# Patient Record
Sex: Female | Born: 1990 | Race: White | Hispanic: No | Smoking: Former smoker
Health system: Southern US, Community
[De-identification: ages and names within clinical notes are randomized; demographics above are authoritative.]

## PROBLEM LIST (undated history)

## (undated) ENCOUNTER — Inpatient Hospital Stay (HOSPITAL_COMMUNITY): Payer: Self-pay

## (undated) DIAGNOSIS — B009 Herpesviral infection, unspecified: Secondary | ICD-10-CM

## (undated) DIAGNOSIS — Z789 Other specified health status: Secondary | ICD-10-CM

## (undated) DIAGNOSIS — Z309 Encounter for contraceptive management, unspecified: Secondary | ICD-10-CM

## (undated) DIAGNOSIS — I10 Essential (primary) hypertension: Secondary | ICD-10-CM

## (undated) DIAGNOSIS — E669 Obesity, unspecified: Secondary | ICD-10-CM

## (undated) DIAGNOSIS — Z3490 Encounter for supervision of normal pregnancy, unspecified, unspecified trimester: Secondary | ICD-10-CM

## (undated) HISTORY — DX: Encounter for contraceptive management, unspecified: Z30.9

## (undated) HISTORY — DX: Essential (primary) hypertension: I10

## (undated) HISTORY — DX: Other specified health status: Z78.9

## (undated) HISTORY — DX: Encounter for supervision of normal pregnancy, unspecified, unspecified trimester: Z34.90

## (undated) HISTORY — PX: WISDOM TOOTH EXTRACTION: SHX21

## (undated) HISTORY — DX: Obesity, unspecified: E66.9

## (undated) HISTORY — DX: Herpesviral infection, unspecified: B00.9

---
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
## 2011-02-25 ENCOUNTER — Other Ambulatory Visit (HOSPITAL_COMMUNITY)
Admission: RE | Admit: 2011-02-25 | Discharge: 2011-02-25 | Disposition: A | Discharge status: Home / Self Care | Payer: Commercial Indemnity | Source: Ambulatory Visit | Attending: Obstetrics and Gynecology | Admitting: Obstetrics and Gynecology

## 2011-02-25 DIAGNOSIS — Z113 Encounter for screening for infections with a predominantly sexual mode of transmission: Secondary | ICD-10-CM | POA: Insufficient documentation

## 2011-02-25 DIAGNOSIS — Z01419 Encounter for gynecological examination (general) (routine) without abnormal findings: Secondary | ICD-10-CM | POA: Insufficient documentation

## 2013-01-27 ENCOUNTER — Ambulatory Visit (INDEPENDENT_AMBULATORY_CARE_PROVIDER_SITE_OTHER): Payer: Managed Care, Other (non HMO) | Admitting: Physician Assistant

## 2013-01-27 ENCOUNTER — Encounter: Payer: Self-pay | Admitting: Physician Assistant

## 2013-01-27 VITALS — BP 116/76 | HR 100 | Temp 99.9°F | Resp 20 | Wt 249.0 lb

## 2013-01-27 DIAGNOSIS — J029 Acute pharyngitis, unspecified: Secondary | ICD-10-CM

## 2013-01-27 NOTE — Progress Notes (Signed)
   [name] MRN: [id number], DOB: Apr 30, 1991, 22 y.o. Date of Encounter: 01/27/2013, 4:13 PM    Chief Complaint: Sore Throat   HPI: 22 y.o. year old female reports that she started to feel sick last night. Developed sore throat, drainage down throat, and body aches.  No nasal drainage/mucus.No chest congestion. No signif fever or chills.      Home Meds: No current outpatient prescriptions on file prior to visit.   No current facility-administered medications on file prior to visit.    Allergies: No Known Allergies    Review of Systems: Constitutional: negative for chills,  night sweats, weight changes HEENT: negative for vision changes, hearing loss, congestion, rhinorrhea,  epistaxis, or sinus pressure Respiratory: negative for hemoptysis, wheezing, shortness of breath, or cough Dermatological: negative for rash Neurologic: negative for headache, dizziness, or syncope    Physical Exam: Blood pressure 116/76, pulse 100, temperature 99.9 F (37.7 C), temperature source Oral, resp. rate 20, weight 249 lb (112.946 kg), last menstrual period 01/18/2013., There is no height on file to calculate BMI. General: Well developed, well nourished, in no acute distress. HEENT: Normocephalic, atraumatic, eyes without discharge, sclera non-icteric, nares are without discharge. Bilateral auditory canals clear, TM's are without perforation, pearly grey and translucent with reflective cone of light bilaterally. Oral cavity moist. No peritonsillar abscess, or post nasal drip. Posterior pharynx and tonsils with mild-mod erythema. No exudate.   Neck: Supple. No thyromegaly. Full ROM. No lymphadenopathy.Mild tenderness with palpation of bilateral anterior cervical nodes. Lungs: Clear bilaterally to auscultation without wheezes, rales, or rhonchi. Breathing is unlabored. Heart: RRR with S1 S2. No murmurs, rubs, or gallops appreciated. Msk:  Strength and tone normal for  age. Extremities/Skin: Warm and dry. No clubbing or cyanosis. No edema. No rashes or suspicious lesions. Neuro: Alert and oriented X 3. Moves all extremities spontaneously. Gait is normal. CNII-XII grossly in tact. Psych:  Responds to questions appropriately with a normal affect.   Labs:  Rapid Strep Test Negative   ASSESSMENT AND PLAN:  22 y.o. year old female with 1. Sore throat. Viral Pharyngitis. Lozenges, Spray, Tylenol,Motrin prn pain.  If develops increased fever or signif worsened symptoms or if persists > 7 days, f/u. -  Signed, [address] [place], [place], [facility] 01/27/2013 4:13 PM

## 2013-02-01 ENCOUNTER — Telehealth: Payer: Self-pay | Admitting: Physician Assistant

## 2013-02-01 DIAGNOSIS — J029 Acute pharyngitis, unspecified: Secondary | ICD-10-CM

## 2013-02-01 MED ORDER — AMOXICILLIN 875 MG PO TABS: 875.0000 mg | ORAL_TABLET | Freq: Two times a day (BID) | ORAL | Status: AC

## 2013-02-01 NOTE — Telephone Encounter (Signed)
Kim,  Call in Rx: Amoxicillin 875mg  1 po BID x 10 Days. Call pt and let her know.

## 2013-02-01 NOTE — Telephone Encounter (Signed)
Pt called and informed Amoxicillin to be called in.  Take BID for 10 days.

## 2013-03-03 ENCOUNTER — Encounter: Payer: Self-pay | Admitting: *Deleted

## 2013-03-04 ENCOUNTER — Encounter: Payer: Self-pay | Admitting: Adult Health

## 2013-03-04 ENCOUNTER — Ambulatory Visit (INDEPENDENT_AMBULATORY_CARE_PROVIDER_SITE_OTHER): Payer: Managed Care, Other (non HMO) | Admitting: Adult Health

## 2013-03-04 ENCOUNTER — Other Ambulatory Visit (HOSPITAL_COMMUNITY)
Admission: RE | Admit: 2013-03-04 | Discharge: 2013-03-04 | Disposition: A | Discharge status: Home / Self Care | Payer: Commercial Indemnity | Source: Ambulatory Visit | Attending: Obstetrics and Gynecology | Admitting: Obstetrics and Gynecology

## 2013-03-04 VITALS — BP 108/70 | HR 76 | Ht 63.0 in | Wt 249.0 lb

## 2013-03-04 DIAGNOSIS — Z0000 Encounter for general adult medical examination without abnormal findings: Secondary | ICD-10-CM

## 2013-03-04 DIAGNOSIS — Z01419 Encounter for gynecological examination (general) (routine) without abnormal findings: Secondary | ICD-10-CM | POA: Insufficient documentation

## 2013-03-04 DIAGNOSIS — Z309 Encounter for contraceptive management, unspecified: Secondary | ICD-10-CM

## 2013-03-04 HISTORY — DX: Encounter for contraceptive management, unspecified: Z30.9

## 2013-03-04 MED ORDER — DROSPIRENONE-ETHINYL ESTRADIOL 3-0.02 MG PO TABS: 1.0000 | ORAL_TABLET | Freq: Every day | ORAL | Status: DC

## 2013-03-04 NOTE — Patient Instructions (Addendum)
Take  yaz Return 1 year for physical Sign up for my chart

## 2013-03-04 NOTE — Progress Notes (Signed)
[name], female   DOB: [birth year], 22 y.o.   MRN: [id number] History of Present Illness: Brittany [name] is a 22 year old white female in for her pap and physical. She likes her Yaz. 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  Current Medications, Allergies, Past Medical History, Past Surgical History, Family History and Social History were reviewed in [employer] record.     Review of Systems:Patient denies any headaches, blurred vision, shortness of breath, chest pain, abdominal pain, problems with bowel movements, urination, or intercourse. No musculoskeletal aches or swelling. No mood changes.   Physical Exam:Blood pressure 108/70, pulse 76, height 5\' 3"  (1.6 m), weight 249 lb (112.946 kg), last menstrual period 02/10/2013. General:  Well developed, well nourished, no acute distress Skin:  Warm and dry, tan Neck:  Midline trachea, normal thyroid Lungs; Clear to auscultation bilaterally Breast:  No dominant palpable mass, retraction, or nipple discharge Cardiovascular: Regular rate and rhythm Abdomen:  Soft, non tender, no hepatosplenomegaly Pelvic:  External genitalia is normal in appearance.  The vagina is normal in appearance. The cervix is nulliparous and everted at os. Pap performed.  Uterus is felt to be normal size, shape, and contour.  No adnexal masses or tenderness noted. Extremities:  No swelling or varicosities noted Psych: Alert and cooperative, and happy  Impression: Yearly exam Contraceptive management  Plan:  Continue Yaz Return in 1 year for physical and can go 2 years on pap

## 2013-04-19 ENCOUNTER — Telehealth: Payer: Self-pay | Admitting: Physician Assistant

## 2013-04-19 NOTE — Telephone Encounter (Signed)
Valtrex 1 gram  Take 2 po Q 12 hours for 2 doses # 4 / 3 refills

## 2013-04-20 MED ORDER — VALACYCLOVIR HCL 1 G PO TABS: ORAL_TABLET | ORAL | Status: DC

## 2013-04-20 NOTE — Telephone Encounter (Signed)
RX sent to pharmacy.  Called pt left mess

## 2013-08-02 ENCOUNTER — Ambulatory Visit (INDEPENDENT_AMBULATORY_CARE_PROVIDER_SITE_OTHER): Payer: Managed Care, Other (non HMO) | Admitting: Family Medicine

## 2013-08-02 ENCOUNTER — Encounter: Payer: Self-pay | Admitting: Family Medicine

## 2013-08-02 DIAGNOSIS — E669 Obesity, unspecified: Secondary | ICD-10-CM | POA: Insufficient documentation

## 2013-08-02 MED ORDER — PHENTERMINE HCL 37.5 MG PO TABS: 37.5000 mg | ORAL_TABLET | Freq: Every day | ORAL | Status: DC

## 2013-08-02 NOTE — Progress Notes (Signed)
Subjective:    [name], female    DOB: November 05, 1991, 22 y.o.   MRN: [id number]  HPI Patient is a very pleasant 61 white female who comes in today for her morbid obesity. She is 5 foot 3 and weighs 251 pounds. She is 100 pounds above her goal weight. She states that she is active although she does not engage in dedicated aerobic exercise. She readily admits that she can much fast food, to much bread, and to many carbohydrates. She also drinks sodas and sweet tea.  She is interested in medication for weight loss. She is also interested in treatment strategies for obesity that involve lifestyle changes.  She denies palpitations, anxiety, chest pain, or history of sudden cardiac death. Past Medical History  Diagnosis Date  . Medical history non-contributory   . Contraceptive management 03/04/2013  . Obesity    Current Outpatient Prescriptions on File Prior to Visit  Medication Sig Dispense Refill  . drospirenone-ethinyl estradiol (YAZ,GIANVI,LORYNA) 3-0.02 MG tablet Take 1 tablet by mouth daily.  1 Package  11  . valACYclovir (VALTREX) 1000 MG tablet Take two tablets by mouth every 12 hrs X 2 doses  4 tablet  3   No current facility-administered medications on file prior to visit.   No Known Allergies History   Social History  . Marital Status: Single    Spouse Name: N/A    Number of Children: N/A  . Years of Education: N/A   Occupational History  . Not on file.   Social History Main Topics  . Smoking status: Former Smoker    Quit date: 01/14/2013  . Smokeless tobacco: Never Used  . Alcohol Use: Yes     Comment: socially  . Drug Use: No  . Sexual Activity: Yes    Birth Control/ Protection: Pill   Other Topics Concern  . Not on file   Social History Narrative  . No narrative on file      Review of Systems  All other systems reviewed and are negative.       Objective:   Physical Exam  Vitals reviewed. Constitutional: She appears well-developed and  well-nourished.  Neck: No thyromegaly present.  Cardiovascular: Normal rate, regular rhythm and normal heart sounds.   No murmur heard. Pulmonary/Chest: Effort normal and breath sounds normal. No respiratory distress. She has no wheezes. She has no rales.  Abdominal: Soft. Bowel sounds are normal. She exhibits no distension. There is no tenderness. There is no rebound.          Assessment & Plan:  1. Morbid obesity First I recommended 1500 calories per day diet. This 500 calories per meal. I recommended she discontinue also does, juices, and sweat tea.  I recommended that she drink water. Also recommended she decrease the consumption of carbohydrates. This includes limiting bread, rice, posture, and potatoes. I recommended she eat more fresh fruits and vegetables. Also recommended that she stop eating fast food and junk food due to their elevated caloric density.  Second I recommended that she increase her aerobic exercise to 30 minutes a day 5 days a week. I recommended she find an exercise partner such as her mother that they can motivate each other to exercise on a regular basis. Third we discussed several medications for weight loss including Topamax, and adipex, orlistat, and belviq.  I stressed that these are to be viewed as adjunctive therapy to lifestyle changes.  She elects to try adipex 37.5 mg poqam.  I prescribed  30 pills with 2 refills. We discussed risk of habituation, cardiovascular risk, and the risk of palpitations and anxiety attacks and insomnia. Recheck in 3 months. Consider TSH. - phentermine (ADIPEX-P) 37.5 MG tablet; Take 1 tablet (37.5 mg total) by mouth daily before breakfast.  Dispense: 30 tablet; Refill: 2

## 2014-02-04 ENCOUNTER — Telehealth: Payer: Self-pay | Admitting: Family Medicine

## 2014-02-04 NOTE — Telephone Encounter (Signed)
Denied.

## 2014-02-04 NOTE — Telephone Encounter (Signed)
Call back number is [phone] Pharmacy [facility]  Pt is needing a refill on phentermine (ADIPEX-P) 37.5 MG tablet ---does the pt need to be seen before this can be refilled or can she just have a refill?

## 2014-02-04 NOTE — Telephone Encounter (Signed)
Pt aware that Dr. Tanya NonesPickard only rx's this type of medication for a 3 month period

## 2014-02-04 NOTE — Telephone Encounter (Signed)
Last seen 08/02/13

## 2014-02-05 ENCOUNTER — Other Ambulatory Visit: Payer: Self-pay | Admitting: Adult Health

## 2014-03-07 ENCOUNTER — Ambulatory Visit (INDEPENDENT_AMBULATORY_CARE_PROVIDER_SITE_OTHER): Payer: Managed Care, Other (non HMO) | Admitting: Adult Health

## 2014-03-07 ENCOUNTER — Encounter: Payer: Self-pay | Admitting: Adult Health

## 2014-03-07 ENCOUNTER — Other Ambulatory Visit: Payer: Managed Care, Other (non HMO) | Admitting: Adult Health

## 2014-03-07 VITALS — BP 120/82 | HR 74 | Ht 63.0 in | Wt 230.0 lb

## 2014-03-07 DIAGNOSIS — Z01419 Encounter for gynecological examination (general) (routine) without abnormal findings: Secondary | ICD-10-CM

## 2014-03-07 DIAGNOSIS — Z309 Encounter for contraceptive management, unspecified: Secondary | ICD-10-CM

## 2014-03-07 DIAGNOSIS — E669 Obesity, unspecified: Secondary | ICD-10-CM

## 2014-03-07 MED ORDER — DROSPIRENONE-ETHINYL ESTRADIOL 3-0.02 MG PO TABS: ORAL_TABLET | ORAL | Status: DC

## 2014-03-07 MED ORDER — PHENTERMINE HCL 37.5 MG PO CAPS: 37.5000 mg | ORAL_CAPSULE | ORAL | Status: DC

## 2014-03-07 NOTE — Progress Notes (Signed)
[name], female   DOB: [birth year], [age] y.o.   MRN: [id number] History of Present Illness: Brittany [name] is a 23 year old white female in for a physical.Had normal pap 03/04/13. Wants to lose weight has used adipex in past and it worked well.  Current Medications, Allergies, Past Medical History, Past Surgical History, Family History and Social History were [facility].     Review of Systems: Patient denies any headaches, blurred vision, shortness of breath, chest pain, abdominal pain, problems with bowel movements, urination, or intercourse. No joint pain or mood swings     Physical Exam:BP 120/82  Pulse 74  Ht 5\' 3"  (1.6 m)  Wt 230 lb (104.327 kg)  BMI 40.75 kg/m2  LMP 02/02/2014 General:  Well developed, well nourished, no acute distress Skin:  Warm and dry Neck:  Midline trachea, normal thyroid Lungs; Clear to auscultation bilaterally Breast:  No dominant palpable mass, retraction, or nipple discharge Cardiovascular: Regular rate and rhythm Abdomen:  Soft, non tender, no hepatosplenomegaly Pelvic:  External genitalia is normal in appearance.  The vagina is normal in appearance. The cervix is everted at os.  Uterus is felt to be normal size, shape, and contour.  No adnexal masses or tenderness noted. Extremities:  No swelling or varicosities noted Psych:  No mood changes,alert and cooperative,seems happy  Impression: Yearly gyn exam no pap Contraceptive management Obesity,weight management   Plan: Rx adipex 37.5 mg #30 1 daily no refills Follow up in  4 weeks Review handout on weight loss Pap and physical in  1 year

## 2014-03-07 NOTE — Patient Instructions (Signed)
Calorie Counting Diet A calorie counting diet requires you to eat the number of calories that are right for you in a day. Calories are the measurement of how much energy you get from the food you eat. Eating the right amount of calories is important for staying at a healthy weight. If you eat too many calories, your body will store them as fat and you may gain weight. If you eat too few calories, you may lose weight. Counting the number of calories you eat during a day will help you know if you are eating the right amount. A Registered Dietitian can determine how many calories you need in a day. The amount of calories needed varies from person to person. If your goal is to lose weight, you will need to eat fewer calories. Losing weight can benefit you if you are overweight or have health problems such as heart disease, high blood pressure, or diabetes. If your goal is to gain weight, you will need to eat more calories. Gaining weight may be necessary if you have a certain health problem that causes your body to need more energy. TIPS Whether you are increasing or decreasing the number of calories you eat during a day, it may be hard to get used to changes in what you eat and drink. The following are tips to help you keep track of the number of calories you eat.  Measure foods at home with measuring cups. This helps you know the amount of food and number of calories you are eating.  Restaurants often serve food in amounts that are larger than 1 serving. While eating out, estimate how many servings of a food you are given. For example, a serving of cooked rice is  cup or about the size of half of a fist. Knowing serving sizes will help you be aware of how much food you are eating at restaurants.  Ask for smaller portion sizes or child-size portions at restaurants.  Plan to eat half of a meal at a restaurant. Take the rest home or share the other half with a friend.  Read the Nutrition Facts panel on  food labels for calorie content and serving size. You can find out how many servings are in a package, the size of a serving, and the number of calories each serving has.  For example, a package might contain 3 cookies. The Nutrition Facts panel on that package says that 1 serving is 1 cookie. Below that, it will say there are 3 servings in the container. The calories section of the Nutrition Facts label says there are 90 calories. This means there are 90 calories in 1 cookie (1 serving). If you eat 1 cookie you have eaten 90 calories. If you eat all 3 cookies, you have eaten 270 calories (3 servings x 90 calories = 270 calories). The list below tells you how big or small some common portion sizes are.  1 oz.........4 stacked dice.  3 oz.........Deck of cards.  1 tsp........Tip of little finger.  1 tbs........Thumb.  2 tbs........Golf ball.   cup.......Half of a fist.  1 cup........A fist. KEEP A FOOD LOG Write down every food item you eat, the amount you eat, and the number of calories in each food you eat during the day. At the end of the day, you can add up the total number of calories you have eaten. It may help to keep a list like the one below. Find out the calorie information by reading the   Nutrition Facts panel on food labels. Breakfast  Bran cereal (1 cup, 110 calories).  Fat-free milk ( cup, 45 calories). Snack  Apple (1 medium, 80 calories). Lunch  Spinach (1 cup, 20 calories).  Tomato ( medium, 20 calories).  Chicken breast strips (3 oz, 165 calories).  Shredded cheddar cheese ( cup, 110 calories).  Light [place] dressing (2 tbs, 60 calories).  Whole-wheat bread (1 slice, 80 calories).  Tub margarine (1 tsp, 35 calories).  Vegetable soup (1 cup, 160 calories). Dinner  Pork chop (3 oz, 190 calories).  Brown rice (1 cup, 215 calories).  Steamed broccoli ( cup, 20 calories).  Strawberries (1  cup, 65 calories).  Whipped cream (1 tbs, 50  calories). Daily Calorie Total: 1425 Document Released: 10/28/2005 Document Revised: 01/20/2012 Document Reviewed: 04/24/2007 [facility] Patient Information 2014 [place], [place]. Try adipex  Follow up in 4 weeks Pap and physical in 1 year

## 2014-04-06 ENCOUNTER — Ambulatory Visit: Payer: Managed Care, Other (non HMO) | Admitting: Adult Health

## 2014-04-11 ENCOUNTER — Ambulatory Visit (INDEPENDENT_AMBULATORY_CARE_PROVIDER_SITE_OTHER): Payer: Managed Care, Other (non HMO) | Admitting: Adult Health

## 2014-04-11 ENCOUNTER — Encounter: Payer: Self-pay | Admitting: Adult Health

## 2014-04-11 VITALS — BP 110/70 | Ht 63.0 in | Wt 223.0 lb

## 2014-04-11 DIAGNOSIS — E669 Obesity, unspecified: Secondary | ICD-10-CM

## 2014-04-11 MED ORDER — PHENTERMINE HCL 37.5 MG PO CAPS: 37.5000 mg | ORAL_CAPSULE | ORAL | Status: DC

## 2014-04-11 NOTE — Patient Instructions (Signed)
Continue weight loss efforts Follow up in 4 weeks 

## 2014-04-11 NOTE — Progress Notes (Signed)
Subjective:     [name], female   DOB: November 15, 1990, 23 y.o.   MRN: [id number]  HPI Brittany [name] is a 23 year old white female in for BP and weight check after starting adipex 03/07/14.No complaints.  Review of Systems See HPI Reviewed past medical,surgical, social and family history. Reviewed medications and allergies.     Objective:   Physical Exam BP 110/70  Ht 5\' 3"  (1.6 m)  Wt 223 lb (101.152 kg)  BMI 39.51 kg/m2  LMP 04/09/2014   Skin warm and dry. Lungs: clear to ausculation bilaterally. Cardiovascular: regular rate and rhythm. She has lost 7 lbs and wants to continue weight loss, she should exercise more.Praised over efforts thus far and she is pleased.Goal is 1-2 lbs per week.  Assessment:     Obesity    Plan:     Rx adipex 37.5 mg #30 1 daily no refills  Return in 4 weeks for weight and BP check Continue weight loss efforts and increase activity

## 2014-05-09 ENCOUNTER — Encounter: Payer: Self-pay | Admitting: Adult Health

## 2014-05-09 ENCOUNTER — Ambulatory Visit (INDEPENDENT_AMBULATORY_CARE_PROVIDER_SITE_OTHER): Payer: Managed Care, Other (non HMO) | Admitting: Adult Health

## 2014-05-09 VITALS — BP 122/82 | Ht 63.0 in | Wt 220.0 lb

## 2014-05-09 DIAGNOSIS — E669 Obesity, unspecified: Secondary | ICD-10-CM

## 2014-05-09 MED ORDER — PHENTERMINE HCL 37.5 MG PO CAPS: 37.5000 mg | ORAL_CAPSULE | ORAL | Status: DC

## 2014-05-09 NOTE — Progress Notes (Signed)
Subjective:     Patient ID: Brittany KatoEmily N [name], female   DOB: 09-12-1991, 23 y.o.   MRN: [id number]  HPI Brittany [name] is a 23 year old white female in for weight and BP check.No complaints.  Review of Systems See HPI Reviewed past medical,surgical, social and family history. Reviewed medications and allergies.     Objective:   Physical Exam BP 122/82  Ht 5\' 3"  (1.6 m)  Wt 220 lb (99.791 kg)  BMI 38.98 kg/m2  LMP 06/27/2015Has lost 3 lbs since last visit for a total of 7 lbs.   Skin warm and dry. Lungs: clear to ausculation bilaterally. Cardiovascular: regular rate and rhythm. Wants to continue taking adipex.Eat lots of veggies and water.  Assessment:    Obesity Weight management    Plan:     Rx adipex 37.5 mg #30 1 daily no refills Follow up in 4 weeks Continue weight loss efforts

## 2014-05-09 NOTE — Patient Instructions (Signed)
Continue weight loss efforts Follow up in 4 weeks 

## 2014-06-07 ENCOUNTER — Ambulatory Visit: Payer: Managed Care, Other (non HMO) | Admitting: Adult Health

## 2014-06-08 ENCOUNTER — Encounter: Payer: Self-pay | Admitting: Adult Health

## 2014-06-08 ENCOUNTER — Ambulatory Visit (INDEPENDENT_AMBULATORY_CARE_PROVIDER_SITE_OTHER): Payer: Managed Care, Other (non HMO) | Admitting: Adult Health

## 2014-06-08 VITALS — BP 120/80 | Ht 63.0 in | Wt 215.0 lb

## 2014-06-08 DIAGNOSIS — E669 Obesity, unspecified: Secondary | ICD-10-CM

## 2014-06-08 MED ORDER — PHENTERMINE HCL 37.5 MG PO CAPS: 37.5000 mg | ORAL_CAPSULE | ORAL | Status: DC

## 2014-06-08 NOTE — Progress Notes (Signed)
Subjective:     Patient ID: Brittany KatoEmily N [name], female   DOB: 02-15-1991, 23 y.o.   MRN: [id number]  HPI Brittany Burtonmily is a 23 year old white female in for weight and BP check, she has lost 35 lbs since September, using adipex on and off.No complaints.  Review of Systems See HPI Reviewed past medical,surgical, social and family history. Reviewed medications and allergies.     Objective:   Physical Exam BP 120/80  Ht 5\' 3"  (1.6 m)  Wt 215 lb (97.523 kg)  BMI 38.09 kg/m2  LMP 06/04/2014   Skin warm and dry. Lungs: clear to ausculation bilaterally. Cardiovascular: regular rate and rhythm. Lost 5 lbs this time, will go one more month then take break and she how she does on her own.  Assessment:     Obesity     Plan:    Continue weight loss efforts and increase exercise Rx adipex 37.5 mg #30 1 daily no refills Follow up prn

## 2014-06-08 NOTE — Patient Instructions (Signed)
Continue weight loss efforts increase exercise Follow up prn

## 2014-09-14 ENCOUNTER — Encounter: Payer: Self-pay | Admitting: Physician Assistant

## 2014-09-14 ENCOUNTER — Ambulatory Visit: Payer: Managed Care, Other (non HMO) | Admitting: Family Medicine

## 2014-09-14 ENCOUNTER — Ambulatory Visit (INDEPENDENT_AMBULATORY_CARE_PROVIDER_SITE_OTHER): Payer: Managed Care, Other (non HMO) | Admitting: Physician Assistant

## 2014-09-14 VITALS — BP 118/86 | HR 68 | Temp 98.3°F | Resp 18 | Wt 212.0 lb

## 2014-09-14 DIAGNOSIS — J020 Streptococcal pharyngitis: Secondary | ICD-10-CM

## 2014-09-14 DIAGNOSIS — J029 Acute pharyngitis, unspecified: Secondary | ICD-10-CM

## 2014-09-14 LAB — RAPID STREP SCREEN (MED CTR MEBANE ONLY): STREPTOCOCCUS, GROUP A SCREEN (DIRECT): POSITIVE — AB

## 2014-09-14 MED ORDER — AMOXICILLIN 500 MG PO CAPS: 500.0000 mg | ORAL_CAPSULE | Freq: Three times a day (TID) | ORAL | Status: DC

## 2014-09-14 NOTE — Progress Notes (Signed)
    [name] MRN: [id number], DOB: Mar 01, 1991, 23 y.o. Date of Encounter: 09/14/2014, 11:42 AM    Chief Complaint:  Chief Complaint  Patient presents with  . sore throat x 3 days     HPI: 23 y.o. year old white female reports that she has had a bad sore throat for 3 days. Has had no known fever. Is having no significant nasal congestion or mucus from the nose and no significant chest congestion or cough. Just really bad sore throat.     Home Meds:   Outpatient Prescriptions Prior to Visit  Medication Sig Dispense Refill  . drospirenone-ethinyl estradiol (LORYNA) 3-0.02 MG tablet take 1 tablet by mouth daily 28 tablet 11  . valACYclovir (VALTREX) 1000 MG tablet Take two tablets by mouth every 12 hrs X 2 doses 4 tablet 3  . phentermine 37.5 MG capsule Take 1 capsule (37.5 mg total) by mouth every morning. 30 capsule 0   No facility-administered medications prior to visit.    Allergies: No Known Allergies    Review of Systems: See HPI for pertinent ROS. All other ROS negative.    Physical Exam: Blood pressure 118/86, pulse 68, temperature 98.3 F (36.8 C), temperature source Oral, resp. rate 18, weight 212 lb (96.163 kg)., Body mass index is 37.56 kg/(m^2). General: WNWD WF.  Appears in no acute distress. HEENT: Normocephalic, atraumatic, eyes without discharge, sclera non-icteric, nares are without discharge. Bilateral auditory canals clear, TM's are without perforation, pearly grey and translucent with reflective cone of light bilaterally. Oral cavity moist, posterior pharynx and tonsils with moderate erythema. No exudate visualized. No peritonsillar abscess.  Neck: Supple. No thyromegaly. Anterior cervical lymph nodes are mildly enlarged but she reports that these really are not very tender. No other lymph nodes are enlarged or tender. Lungs: Clear bilaterally to auscultation without wheezes, rales, or rhonchi. Breathing is unlabored. Heart: Regular rhythm.  No murmurs, rubs, or gallops. Msk:  Strength and tone normal for age. Extremities/Skin: Warm and dry.  No rashes. Neuro: Alert and oriented X 3. Moves all extremities spontaneously. Gait is normal. CNII-XII grossly in tact. Psych:  Responds to questions appropriately with a normal affect.      ASSESSMENT AND PLAN:  23 y.o. year old female with  1. Strep pharyngitis - amoxicillin (AMOXIL) 500 MG capsule; Take 1 capsule (500 mg total) by mouth 3 (three) times daily.  Dispense: 30 capsule; Refill: 0 Take the amoxicillin as directed and make sure to complete all of it. Can use over-the-counter lozenges spray Tylenol Motrin as needed for pain relief. Follow-up if symptoms worsen or do not resolve with completion of antibiotic.  2. Sorethroat - Rapid Strep Screen   Signed, [address], [place], [facility] 09/14/2014 11:42 AM

## 2014-11-16 ENCOUNTER — Encounter: Payer: Self-pay | Admitting: Physician Assistant

## 2014-11-16 ENCOUNTER — Ambulatory Visit (INDEPENDENT_AMBULATORY_CARE_PROVIDER_SITE_OTHER): Payer: Managed Care, Other (non HMO) | Admitting: Physician Assistant

## 2014-11-16 VITALS — BP 114/74 | HR 100 | Temp 98.7°F | Resp 18 | Wt 216.0 lb

## 2014-11-16 DIAGNOSIS — J029 Acute pharyngitis, unspecified: Secondary | ICD-10-CM

## 2014-11-16 LAB — RAPID STREP SCREEN (MED CTR MEBANE ONLY): Streptococcus, Group A Screen (Direct): NEGATIVE

## 2014-11-17 NOTE — Progress Notes (Signed)
    [name] MRN: [id number], DOB: 04-Nov-1991, 23 y.o. Date of Encounter: 11/17/2014, 7:48 AM    Chief Complaint:  Chief Complaint  Patient presents with  . recurrent sore throat    had strep 1 mth ago     HPI: 24 y.o. year old white female presents with the above symptoms.  I reviewed her chart. I saw her on 09/14/14 with complaints of sore throat. Strep test was positive. Treated with amoxicillin 500 mg 3 times a day 10 days. She says that she did take the antibiotic as directed completed all of it. She says that those symptoms completely resolved.  She had no recurrent symptoms until late Monday/Tuesday morning. Tuesday morning was 11/15/14.  She says that she just has sore throat. Really has no nasal congestion or mucus from the nose. No chest congestion or cough. No fevers or chills.     Home Meds:   Outpatient Prescriptions Prior to Visit  Medication Sig Dispense Refill  . drospirenone-ethinyl estradiol (LORYNA) 3-0.02 MG tablet take 1 tablet by mouth daily 28 tablet 11  . valACYclovir (VALTREX) 1000 MG tablet Take two tablets by mouth every 12 hrs X 2 doses 4 tablet 3  . phentermine 37.5 MG capsule Take 1 capsule (37.5 mg total) by mouth every morning. (Patient not taking: Reported on 11/16/2014) 30 capsule 0  . amoxicillin (AMOXIL) 500 MG capsule Take 1 capsule (500 mg total) by mouth 3 (three) times daily. 30 capsule 0   No facility-administered medications prior to visit.    Allergies: No Known Allergies    Review of Systems: See HPI for pertinent ROS. All other ROS negative.    Physical Exam: Blood pressure 114/74, pulse 100, temperature 98.7 F (37.1 C), temperature source Oral, resp. rate 18, weight 216 lb (97.977 kg)., Body mass index is 38.27 kg/(m^2). General: Obese white female.  Appears in no acute distress. HEENT: Normocephalic, atraumatic, eyes without discharge, sclera non-icteric, nares are without discharge. Bilateral auditory canals  clear, TM's are without perforation, pearly grey and translucent with reflective cone of light bilaterally. Oral cavity moist, posterior pharynx without exudate or peritonsillar abscess. There is mild-moderate erythema. Neck: Supple. No thyromegaly. No lymphadenopathy. Lymph nodes are not significantly tender or enlarged. Lungs: Clear bilaterally to auscultation without wheezes, rales, or rhonchi. Breathing is unlabored. Heart: Regular rhythm. No murmurs, rubs, or gallops. Msk:  Strength and tone normal for age. Extremities/Skin: Warm and dry.  Neuro: Alert and oriented X 3. Moves all extremities spontaneously. Gait is normal. CNII-XII grossly in tact. Psych:  Responds to questions appropriately with a normal affect.     ASSESSMENT AND PLAN:  24 y.o. year old female with  1. Viral pharyngitis  2. Sorethroat - Rapid strep screen  Results for orders placed or performed in visit on 11/16/14  Rapid strep screen  Result Value Ref Range   Source THROAT    Streptococcus, Group A Screen (Direct) NEG NEGATIVE   Discussed that I think that this infection is a viral infection. Treat symptomatically with lozenges spray Tylenol Motrin as needed for pain. Call us if develops significant fever or if symptoms worsen significantly or persist greater than 7 days.   Murray HodgkinsSigned, Danett Palazzo Beth Silver CreekDixon, [place]PA, [facility] 11/17/2014 7:48 AM

## 2014-11-23 ENCOUNTER — Telehealth: Payer: Self-pay | Admitting: Family Medicine

## 2014-11-23 NOTE — Telephone Encounter (Signed)
[phone] Patient calling saying her throat is still hurting her would like to knowif we can call something in for her  [facility]

## 2014-11-23 NOTE — Telephone Encounter (Signed)
Yes.  Amoxicillin 500mg  1 po TID x 10 days # 30 + 0.

## 2014-11-24 MED ORDER — AMOXICILLIN 500 MG PO CAPS: 500.0000 mg | ORAL_CAPSULE | Freq: Three times a day (TID) | ORAL | Status: DC

## 2014-11-24 NOTE — Telephone Encounter (Signed)
Rx to pharmacy, left pt message.

## 2015-02-22 ENCOUNTER — Telehealth: Payer: Self-pay | Admitting: Family Medicine

## 2015-02-22 MED ORDER — VALACYCLOVIR HCL 1 G PO TABS: ORAL_TABLET | ORAL | Status: DC

## 2015-02-22 NOTE — Telephone Encounter (Signed)
Med sent to pharm 

## 2015-02-22 NOTE — Telephone Encounter (Signed)
Patient would like refill on valtrex  [facility]

## 2015-03-05 ENCOUNTER — Other Ambulatory Visit: Payer: Self-pay | Admitting: Adult Health

## 2015-03-10 ENCOUNTER — Other Ambulatory Visit (HOSPITAL_COMMUNITY)
Admission: RE | Admit: 2015-03-10 | Discharge: 2015-03-10 | Disposition: A | Discharge status: Home / Self Care | Payer: Commercial Indemnity | Source: Ambulatory Visit | Attending: Obstetrics and Gynecology | Admitting: Obstetrics and Gynecology

## 2015-03-10 ENCOUNTER — Ambulatory Visit (INDEPENDENT_AMBULATORY_CARE_PROVIDER_SITE_OTHER): Payer: Managed Care, Other (non HMO) | Admitting: Adult Health

## 2015-03-10 ENCOUNTER — Encounter: Payer: Self-pay | Admitting: Adult Health

## 2015-03-10 VITALS — BP 118/70 | HR 88 | Ht 62.5 in | Wt 226.0 lb

## 2015-03-10 DIAGNOSIS — E669 Obesity, unspecified: Secondary | ICD-10-CM

## 2015-03-10 DIAGNOSIS — Z3041 Encounter for surveillance of contraceptive pills: Secondary | ICD-10-CM

## 2015-03-10 DIAGNOSIS — Z01419 Encounter for gynecological examination (general) (routine) without abnormal findings: Secondary | ICD-10-CM | POA: Insufficient documentation

## 2015-03-10 MED ORDER — DROSPIRENONE-ETHINYL ESTRADIOL 3-0.02 MG PO TABS: 1.0000 | ORAL_TABLET | Freq: Every day | ORAL | Status: DC

## 2015-03-10 MED ORDER — PHENTERMINE HCL 37.5 MG PO TABS: 37.5000 mg | ORAL_TABLET | Freq: Every day | ORAL | Status: DC

## 2015-03-10 NOTE — Progress Notes (Signed)
[name], female   DOB: 01-10-1991, 24 y.o.   MRN: [id number] History of Present Illness: Brittany [name] is a 24 year old white female in for well woman gyn exam and pap and wants to lose weight.   Current Medications, Allergies, Past Medical History, Past Surgical History, Family History and Social History were [facility].     Review of Systems: Patient denies any headaches, hearing loss, fatigue, blurred vision, shortness of breath, chest pain, abdominal pain, problems with bowel movements, urination, or intercourse. No joint pain or mood swings.She is happy with Brittany [name] and wants to continue.    Physical Exam:BP 118/70 mmHg  Pulse 88  Ht 5' 2.5" (1.588 m)  Wt 226 lb (102.513 kg)  BMI 40.65 kg/m2  LMP 02/04/2015 General:  Well developed, well nourished, no acute distress Skin:  Warm and dry Neck:  Midline trachea, normal thyroid, good ROM, no lymphadenopathy Lungs; Clear to auscultation bilaterally Breast:  No dominant palpable mass, retraction, or nipple discharge Cardiovascular: Regular rate and rhythm Abdomen:  Soft, non tender, no hepatosplenomegaly Pelvic:  External genitalia is normal in appearance, no lesions.  The vagina is normal in appearance. Urethra has no lesions or masses. The cervix is everted at os, pap perfomred.  Uterus is felt to be normal size, shape, and contour.  No adnexal masses or tenderness noted.Bladder is non tender, no masses felt. Extremities/musculoskeletal:  No swelling or varicosities noted, no clubbing or cyanosis Psych:  No mood changes, alert and cooperative,seems happy   Impression: Well woman gyn exam with pap Contraceptive management Obesity     Plan: Refilled yaz, take 1 daily # 1 pack with 11 refills Rx adipex 37.5 mg #30 1 daily no refills  Return in 4 weeks for weight and BP check Physical in 1 year Review handout on weight loss, increase protein,decrease carbs, increase water and  walk

## 2015-03-10 NOTE — Patient Instructions (Signed)
Physical in 1 year Follow up in 4 weeks Calorie Counting for Weight Loss Calories are energy you get from the things you eat and drink. Your body uses this energy to keep you going throughout the day. The number of calories you eat affects your weight. When you eat more calories than your body needs, your body stores the extra calories as fat. When you eat fewer calories than your body needs, your body burns fat to get the energy it needs. Calorie counting means keeping track of how many calories you eat and drink each day. If you make sure to eat fewer calories than your body needs, you should lose weight. In order for calorie counting to work, you will need to eat the number of calories that are right for you in a day to lose a healthy amount of weight per week. A healthy amount of weight to lose per week is usually 1-2 lb (0.5-0.9 kg). A dietitian can determine how many calories you need in a day and give you suggestions on how to reach your calorie goal.  WHAT IS MY MY PLAN? My goal is to have __________ calories per day.  If I have this many calories per day, I should lose around __________ pounds per week. WHAT DO I NEED TO KNOW ABOUT CALORIE COUNTING? In order to meet your daily calorie goal, you will need to:  Find out how many calories are in each food you would like to eat. Try to do this before you eat.  Decide how much of the food you can eat.  Write down what you ate and how many calories it had. Doing this is called keeping a food log. WHERE DO I FIND CALORIE INFORMATION? The number of calories in a food can be found on a Nutrition Facts label. Note that all the information on a label is based on a specific serving of the food. If a food does not have a Nutrition Facts label, try to look up the calories online or ask your dietitian for help. HOW DO I DECIDE HOW MUCH TO EAT? To decide how much of the food you can eat, you will need to consider both the number of calories in one  serving and the size of one serving. This information can be found on the Nutrition Facts label. If a food does not have a Nutrition Facts label, look up the information online or ask your dietitian for help. Remember that calories are listed per serving. If you choose to have more than one serving of a food, you will have to multiply the calories per serving by the amount of servings you plan to eat. For example, the label on a package of bread might say that a serving size is 1 slice and that there are 90 calories in a serving. If you eat 1 slice, you will have eaten 90 calories. If you eat 2 slices, you will have eaten 180 calories. HOW DO I KEEP A FOOD LOG? After each meal, record the following information in your food log:  What you ate.  How much of it you ate.  How many calories it had.  Then, add up your calories. Keep your food log near you, such as in a small notebook in your pocket. Another option is to use a mobile app or website. Some programs will calculate calories for you and show you how many calories you have left each time you add an item to the log. WHAT ARE SOME  CALORIE COUNTING TIPS?  Use your calories on foods and drinks that will fill you up and not leave you hungry. Some examples of this include foods like nuts and nut butters, vegetables, lean proteins, and high-fiber foods (more than 5 g fiber per serving).  Eat nutritious foods and avoid empty calories. Empty calories are calories you get from foods or beverages that do not have many nutrients, such as candy and soda. It is better to have a nutritious high-calorie food (such as an avocado) than a food with few nutrients (such as a bag of chips).  Know how many calories are in the foods you eat most often. This way, you do not have to look up how many calories they have each time you eat them.  Look out for foods that may seem like low-calorie foods but are really high-calorie foods, such as baked goods, soda, and  fat-free candy.  Pay attention to calories in drinks. Drinks such as sodas, specialty coffee drinks, alcohol, and juices have a lot of calories yet do not fill you up. Choose low-calorie drinks like water and diet drinks.  Focus your calorie counting efforts on higher calorie items. Logging the calories in a garden salad that contains only vegetables is less important than calculating the calories in a milk shake.  Find a way of tracking calories that works for you. Get creative. Most people who are successful find ways to keep track of how much they eat in a day, even if they do not count every calorie. WHAT ARE SOME PORTION CONTROL TIPS?  Know how many calories are in a serving. This will help you know how many servings of a certain food you can have.  Use a measuring cup to measure serving sizes. This is helpful when you start out. With time, you will be able to estimate serving sizes for some foods.  Take some time to put servings of different foods on your favorite plates, bowls, and cups so you know what a serving looks like.  Try not to eat straight from a bag or box. Doing this can lead to overeating. Put the amount you would like to eat in a cup or on a plate to make sure you are eating the right portion.  Use smaller plates, glasses, and bowls to prevent overeating. This is a quick and easy way to practice portion control. If your plate is smaller, less food can fit on it.  Try not to multitask while eating, such as watching TV or using your computer. If it is time to eat, sit down at a table and enjoy your food. Doing this will help you to start recognizing when you are full. It will also make you more aware of what and how much you are eating. HOW CAN I CALORIE COUNT WHEN EATING OUT?  Ask for smaller portion sizes or child-sized portions.  Consider sharing an entree and sides instead of getting your own entree.  If you get your own entree, eat only half. Ask for a box at the  beginning of your meal and put the rest of your entree in it so you are not tempted to eat it.  Look for the calories on the menu. If calories are listed, choose the lower calorie options.  Choose dishes that include vegetables, fruits, whole grains, low-fat dairy products, and lean protein. Focusing on smart food choices from each of the 5 food groups can help you stay on track at restaurants.  Choose items  that are boiled, broiled, grilled, or steamed.  Choose water, milk, unsweetened iced tea, or other drinks without added sugars. If you want an alcoholic beverage, choose a lower calorie option. For example, a regular margarita can have up to 700 calories and a glass of wine has around 150.  Stay away from items that are buttered, battered, fried, or served with cream sauce. Items labeled "crispy" are usually fried, unless stated otherwise.  Ask for dressings, sauces, and syrups on the side. These are usually very high in calories, so do not eat much of them.  Watch out for salads. Many people think salads are a healthy option, but this is often not the case. Many salads come with bacon, fried chicken, lots of cheese, fried chips, and dressing. All of these items have a lot of calories. If you want a salad, choose a garden salad and ask for grilled meats or steak. Ask for the dressing on the side, or ask for olive oil and vinegar or lemon to use as dressing.  Estimate how many servings of a food you are given. For example, a serving of cooked rice is  cup or about the size of half a tennis ball or one cupcake wrapper. Knowing serving sizes will help you be aware of how much food you are eating at restaurants. The list below tells you how big or small some common portion sizes are based on everyday objects.  1 oz--4 stacked dice.  3 oz--1 deck of cards.  1 tsp--1 dice.  1 Tbsp-- a Ping-Pong ball.  2 Tbsp--1 Ping-Pong ball.   cup--1 tennis ball or 1 cupcake wrapper.  1 cup--1  baseball. Document Released: 10/28/2005 Document Revised: 03/14/2014 Document Reviewed: 09/02/2013 [facility] Patient Information 2015 [place], [place]. This information is not intended to replace advice given to you by your health care provider. Make sure you discuss any questions you have with your health care provider.

## 2015-03-14 LAB — CYTOLOGY - PAP

## 2015-04-06 ENCOUNTER — Telehealth: Payer: Self-pay | Admitting: Adult Health

## 2015-04-06 MED ORDER — FLUCONAZOLE 150 MG PO TABS: ORAL_TABLET | ORAL | Status: DC

## 2015-04-06 NOTE — Telephone Encounter (Signed)
Will rx diflucan, pt aware

## 2015-04-06 NOTE — Telephone Encounter (Signed)
Left message x 1. JSY 

## 2015-04-06 NOTE — Telephone Encounter (Signed)
Spoke with pt. Pt has a yeast infection. Started with symptoms yesterday morning. Has tried OTC meds with no help. Can you order med? Pt was seen recently. Thanks!! JSY

## 2015-04-11 ENCOUNTER — Encounter: Payer: Self-pay | Admitting: Adult Health

## 2015-04-11 ENCOUNTER — Ambulatory Visit (INDEPENDENT_AMBULATORY_CARE_PROVIDER_SITE_OTHER): Payer: Managed Care, Other (non HMO) | Admitting: Adult Health

## 2015-04-11 VITALS — BP 104/76 | HR 92 | Ht 62.5 in | Wt 223.5 lb

## 2015-04-11 DIAGNOSIS — E669 Obesity, unspecified: Secondary | ICD-10-CM | POA: Diagnosis not present

## 2015-04-11 DIAGNOSIS — Z713 Dietary counseling and surveillance: Secondary | ICD-10-CM | POA: Diagnosis not present

## 2015-04-11 DIAGNOSIS — Z6841 Body Mass Index (BMI) 40.0 and over, adult: Secondary | ICD-10-CM

## 2015-04-11 MED ORDER — PHENTERMINE HCL 37.5 MG PO TABS: 37.5000 mg | ORAL_TABLET | Freq: Every day | ORAL | Status: DC

## 2015-04-11 NOTE — Progress Notes (Signed)
Subjective:     Patient ID: Brittany KatoEmily N [name], female   DOB: [birth year], 24 y.o.   MRN: [id number]  HPI Brittany Burtonmily is a 24 year old white female in to discuss losing weight.  Review of Systems Patient denies any headaches, hearing loss, fatigue, blurred vision, shortness of breath, chest pain, abdominal pain, problems with bowel movements, urination, or intercourse. No joint pain or mood swings. Reviewed past medical,surgical, social and family history. Reviewed medications and allergies.     Objective:   Physical Exam BP 104/76 mmHg  Pulse 92  Ht 5' 2.5" (1.588 m)  Wt 223 lb 8 oz (101.379 kg)  BMI 40.20 kg/m2  LMP 04/08/2015 Skin warm and dry. Lungs: clear to ausculation bilaterally. Cardiovascular: regular rate and rhythm.Counselled on eating more fresh fruit and veggies and less carbs and processed foods.And increasing activity. Did lose 2.5 lbs in 4 weeks.    Assessment:     Encounter for weight loss counseling  Obesity     Plan:    Try Whole 30 Rx adipex 37.5 mg #30 take 1 daily Follow up in 4 weeks Decrease carbs and increase water Increase exercise Review handout on weight loss

## 2015-04-11 NOTE — Patient Instructions (Signed)
Try whole 30 Decrease carbs Increase water Increase exercise Follow uCalorie Counting for Weight Loss Calories are energy you get from the things you eat and drink. Your body uses this energy to keep you going throughout the day. The number of calories you eat affects your weight. When you eat more calories than your body needs, your body stores the extra calories as fat. When you eat fewer calories than your body needs, your body burns fat to get the energy it needs. Calorie counting means keeping track of how many calories you eat and drink each day. If you make sure to eat fewer calories than your body needs, you should lose weight. In order for calorie counting to work, you will need to eat the number of calories that are right for you in a day to lose a healthy amount of weight per week. A healthy amount of weight to lose per week is usually 1-2 lb (0.5-0.9 kg). A dietitian can determine how many calories you need in a day and give you suggestions on how to reach your calorie goal.  WHAT IS MY MY PLAN? My goal is to have ______1500____ calories per day.  If I have this many calories per day, I should lose around ______1-2____ pounds per week. WHAT DO I NEED TO KNOW ABOUT CALORIE COUNTING? In order to meet your daily calorie goal, you will need to:  Find out how many calories are in each food you would like to eat. Try to do this before you eat.  Decide how much of the food you can eat.  Write down what you ate and how many calories it had. Doing this is called keeping a food log. WHERE DO I FIND CALORIE INFORMATION? The number of calories in a food can be found on a Nutrition Facts label. Note that all the information on a label is based on a specific serving of the food. If a food does not have a Nutrition Facts label, try to look up the calories online or ask your dietitian for help. HOW DO I DECIDE HOW MUCH TO EAT? To decide how much of the food you can eat, you will need to consider  both the number of calories in one serving and the size of one serving. This information can be found on the Nutrition Facts label. If a food does not have a Nutrition Facts label, look up the information online or ask your dietitian for help. Remember that calories are listed per serving. If you choose to have more than one serving of a food, you will have to multiply the calories per serving by the amount of servings you plan to eat. For example, the label on a package of bread might say that a serving size is 1 slice and that there are 90 calories in a serving. If you eat 1 slice, you will have eaten 90 calories. If you eat 2 slices, you will have eaten 180 calories. HOW DO I KEEP A FOOD LOG? After each meal, record the following information in your food log:  What you ate.  How much of it you ate.  How many calories it had.  Then, add up your calories. Keep your food log near you, such as in a small notebook in your pocket. Another option is to use a mobile app or website. Some programs will calculate calories for you and show you how many calories you have left each time you add an item to the log. WHAT ARE  SOME CALORIE COUNTING TIPS?  Use your calories on foods and drinks that will fill you up and not leave you hungry. Some examples of this include foods like nuts and nut butters, vegetables, lean proteins, and high-fiber foods (more than 5 g fiber per serving).  Eat nutritious foods and avoid empty calories. Empty calories are calories you get from foods or beverages that do not have many nutrients, such as candy and soda. It is better to have a nutritious high-calorie food (such as an avocado) than a food with few nutrients (such as a bag of chips).  Know how many calories are in the foods you eat most often. This way, you do not have to look up how many calories they have each time you eat them.  Look out for foods that may seem like low-calorie foods but are really high-calorie foods,  such as baked goods, soda, and fat-free candy.  Pay attention to calories in drinks. Drinks such as sodas, specialty coffee drinks, alcohol, and juices have a lot of calories yet do not fill you up. Choose low-calorie drinks like water and diet drinks.  Focus your calorie counting efforts on higher calorie items. Logging the calories in a garden salad that contains only vegetables is less important than calculating the calories in a milk shake.  Find a way of tracking calories that works for you. Get creative. Most people who are successful find ways to keep track of how much they eat in a day, even if they do not count every calorie. WHAT ARE SOME PORTION CONTROL TIPS?  Know how many calories are in a serving. This will help you know how many servings of a certain food you can have.  Use a measuring cup to measure serving sizes. This is helpful when you start out. With time, you will be able to estimate serving sizes for some foods.  Take some time to put servings of different foods on your favorite plates, bowls, and cups so you know what a serving looks like.  Try not to eat straight from a bag or box. Doing this can lead to overeating. Put the amount you would like to eat in a cup or on a plate to make sure you are eating the right portion.  Use smaller plates, glasses, and bowls to prevent overeating. This is a quick and easy way to practice portion control. If your plate is smaller, less food can fit on it.  Try not to multitask while eating, such as watching TV or using your computer. If it is time to eat, sit down at a table and enjoy your food. Doing this will help you to start recognizing when you are full. It will also make you more aware of what and how much you are eating. HOW CAN I CALORIE COUNT WHEN EATING OUT?  Ask for smaller portion sizes or child-sized portions.  Consider sharing an entree and sides instead of getting your own entree.  If you get your own entree, eat only  half. Ask for a box at the beginning of your meal and put the rest of your entree in it so you are not tempted to eat it.  Look for the calories on the menu. If calories are listed, choose the lower calorie options.  Choose dishes that include vegetables, fruits, whole grains, low-fat dairy products, and lean protein. Focusing on smart food choices from each of the 5 food groups can help you stay on track at restaurants.  Choose  items that are boiled, broiled, grilled, or steamed.  Choose water, milk, unsweetened iced tea, or other drinks without added sugars. If you want an alcoholic beverage, choose a lower calorie option. For example, a regular margarita can have up to 700 calories and a glass of wine has around 150.  Stay away from items that are buttered, battered, fried, or served with cream sauce. Items labeled "crispy" are usually fried, unless stated otherwise.  Ask for dressings, sauces, and syrups on the side. These are usually very high in calories, so do not eat much of them.  Watch out for salads. Many people think salads are a healthy option, but this is often not the case. Many salads come with bacon, fried chicken, lots of cheese, fried chips, and dressing. All of these items have a lot of calories. If you want a salad, choose a garden salad and ask for grilled meats or steak. Ask for the dressing on the side, or ask for olive oil and vinegar or lemon to use as dressing.  Estimate how many servings of a food you are given. For example, a serving of cooked rice is  cup or about the size of half a tennis ball or one cupcake wrapper. Knowing serving sizes will help you be aware of how much food you are eating at restaurants. The list below tells you how big or small some common portion sizes are based on everyday objects.  1 oz--4 stacked dice.  3 oz--1 deck of cards.  1 tsp--1 dice.  1 Tbsp-- a Ping-Pong ball.  2 Tbsp--1 Ping-Pong ball.   cup--1 tennis ball or 1 cupcake  wrapper.  1 cup--1 baseball. Document Released: 10/28/2005 Document Revised: 03/14/2014 Document Reviewed: 09/02/2013 [facility] Patient Information 2015 [place]ExitCare, [place]LLC. This information is not intended to replace advice given to you by your health care provider. Make sure you discuss any questions you have with your health care provider. p in 4 weeks

## 2015-05-09 ENCOUNTER — Encounter: Payer: Self-pay | Admitting: Adult Health

## 2015-05-09 ENCOUNTER — Ambulatory Visit (INDEPENDENT_AMBULATORY_CARE_PROVIDER_SITE_OTHER): Payer: Managed Care, Other (non HMO) | Admitting: Adult Health

## 2015-05-09 VITALS — BP 120/80 | HR 84 | Ht 62.5 in | Wt 222.0 lb

## 2015-05-09 DIAGNOSIS — Z713 Dietary counseling and surveillance: Secondary | ICD-10-CM

## 2015-05-09 DIAGNOSIS — Z6839 Body mass index (BMI) 39.0-39.9, adult: Secondary | ICD-10-CM | POA: Diagnosis not present

## 2015-05-09 DIAGNOSIS — Z1329 Encounter for screening for other suspected endocrine disorder: Secondary | ICD-10-CM

## 2015-05-09 DIAGNOSIS — E669 Obesity, unspecified: Secondary | ICD-10-CM

## 2015-05-09 DIAGNOSIS — Z139 Encounter for screening, unspecified: Secondary | ICD-10-CM

## 2015-05-09 DIAGNOSIS — Z13220 Encounter for screening for lipoid disorders: Secondary | ICD-10-CM

## 2015-05-09 MED ORDER — PHENTERMINE HCL 37.5 MG PO TABS: 37.5000 mg | ORAL_TABLET | Freq: Every day | ORAL | Status: DC

## 2015-05-09 NOTE — Patient Instructions (Signed)
Follow up prn Increase exercise  Decrease carbs

## 2015-05-09 NOTE — Progress Notes (Signed)
Subjective:     [name], female   DOB: [birth year], 24 y.o.   MRN: [id number]  HPI [name] is a 24 year old white female in for weight check, no complaints.  Review of Systems Patient denies any headaches, hearing loss, fatigue, blurred vision, shortness of breath, chest pain, abdominal pain, problems with bowel movements, urination, or intercourse. No joint pain or mood swings. Reviewed past medical,surgical, social and family history. Reviewed medications and allergies.     Objective:   Physical Exam BP 120/80 mmHg  Pulse 84  Ht 5' 2.5" (1.588 m)  Wt 222 lb (100.699 kg)  BMI 39.93 kg/m2  LMP 05/06/2015 Skin warm and dry.  Lungs: clear to ausculation bilaterally. Cardiovascular: regular rate and rhythm.Waist circumference 43 inches. Has lost 4 lbs.    Assessment:     Weight loss counseling BMI 39.9 Obesity Screening labs    Plan:    Check CBC,CMP,TSH and lipids Rx adipex 37.5 mg #30 take 1 daily Increase exercise and decrease carbs Follow up prn

## 2015-05-10 ENCOUNTER — Telehealth: Payer: Self-pay | Admitting: *Deleted

## 2015-05-10 ENCOUNTER — Telehealth: Payer: Self-pay | Admitting: Adult Health

## 2015-05-10 LAB — CBC
HEMOGLOBIN: 12.4 g/dL (ref 11.1–15.9)
Hematocrit: 38.2 % (ref 34.0–46.6)
MCH: 29 pg (ref 26.6–33.0)
MCHC: 32.5 g/dL (ref 31.5–35.7)
MCV: 90 fL (ref 79–97)
PLATELETS: 395 10*3/uL — AB (ref 150–379)
RBC: 4.27 x10E6/uL (ref 3.77–5.28)
RDW: 13.2 % (ref 12.3–15.4)
WBC: 10.2 10*3/uL (ref 3.4–10.8)

## 2015-05-10 LAB — COMPREHENSIVE METABOLIC PANEL
ALT: 13 IU/L (ref 0–32)
AST: 14 IU/L (ref 0–40)
Albumin/Globulin Ratio: 1.5 (ref 1.1–2.5)
Albumin: 4.1 g/dL (ref 3.5–5.5)
Alkaline Phosphatase: 57 IU/L (ref 39–117)
BUN / CREAT RATIO: 14 (ref 8–20)
BUN: 11 mg/dL (ref 6–20)
Bilirubin Total: <0.2 mg/dL (ref 0.0–1.2)
CALCIUM: 9.6 mg/dL (ref 8.7–10.2)
CO2: 26 mmol/L (ref 18–29)
CREATININE: 0.77 mg/dL (ref 0.57–1.00)
Chloride: 100 mmol/L (ref 97–108)
GFR calc Af Amer: 124 mL/min/{1.73_m2} (ref >59)
GFR, EST NON AFRICAN AMERICAN: 108 mL/min/{1.73_m2} (ref >59)
Globulin, Total: 2.7 g/dL (ref 1.5–4.5)
Glucose: 82 mg/dL (ref 65–99)
POTASSIUM: 4.4 mmol/L (ref 3.5–5.2)
SODIUM: 139 mmol/L (ref 134–144)
Total Protein: 6.8 g/dL (ref 6.0–8.5)

## 2015-05-10 LAB — LIPID PANEL
CHOLESTEROL TOTAL: 173 mg/dL (ref 100–199)
Chol/HDL Ratio: 3.8 ratio units (ref 0.0–4.4)
HDL: 45 mg/dL (ref >39)
LDL Calculated: 78 mg/dL (ref 0–99)
Triglycerides: 252 mg/dL — ABNORMAL HIGH (ref 0–149)
VLDL Cholesterol Cal: 50 mg/dL — ABNORMAL HIGH (ref 5–40)

## 2015-05-10 LAB — TSH: TSH: 1.07 u[IU]/mL (ref 0.450–4.500)

## 2015-05-10 NOTE — Telephone Encounter (Signed)
Pt aware of labs, work on diet and weight loss

## 2015-05-10 NOTE — Telephone Encounter (Signed)
Mailbox full

## 2015-08-16 ENCOUNTER — Other Ambulatory Visit: Payer: Self-pay | Admitting: Adult Health

## 2016-03-12 ENCOUNTER — Ambulatory Visit (INDEPENDENT_AMBULATORY_CARE_PROVIDER_SITE_OTHER): Payer: Managed Care, Other (non HMO) | Admitting: Adult Health

## 2016-03-12 ENCOUNTER — Encounter: Payer: Self-pay | Admitting: Adult Health

## 2016-03-12 VITALS — BP 118/72 | HR 88 | Ht 62.25 in | Wt 241.0 lb

## 2016-03-12 DIAGNOSIS — Z01419 Encounter for gynecological examination (general) (routine) without abnormal findings: Secondary | ICD-10-CM

## 2016-03-12 NOTE — Progress Notes (Signed)
[name], female   DOB: October 21, 1991, 25 y.o.   MRN: [id number] History of Present Illness: Brittany [name] is a 25 year old white female, in for well woman gyn exam , she had a normal pap 03/10/15. She is not using birth control at present and is OK if gets pregnant, has bought a house.  Current Medications, Allergies, Past Medical History, Past Surgical History, Family History and Social History were reviewed in [facility] electronic medical record.     Review of Systems: Patient denies any headaches, hearing loss, fatigue, blurred vision, shortness of breath, chest pain, abdominal pain, problems with bowel movements, urination, or intercourse. No joint pain or mood swings.    Physical Exam:BP 118/72 mmHg  Pulse 88  Ht 5' 2.25" (1.581 m)  Wt 241 lb (109.317 kg)  BMI 43.73 kg/m2  LMP 02/10/2016 General:  Well developed, well nourished, no acute distress Skin:  Warm and dry Neck:  Midline trachea, normal thyroid, good ROM, no lymphadenopathy Lungs; Clear to auscultation bilaterally Breast:  No dominant palpable mass, retraction, or nipple discharge Cardiovascular: Regular rate and rhythm Abdomen:  Soft, non tender, no hepatosplenomegaly Pelvic:  External genitalia is normal in appearance, no lesions.  The vagina is normal in appearance. Urethra has no lesions or masses. The cervix is nullipareous.  Uterus is felt to be normal size, shape, and contour.  No adnexal masses or tenderness noted.Bladder is non tender, no masses felt. Extremities/musculoskeletal:  No swelling or varicosities noted, no clubbing or cyanosis Psych:  No mood changes, alert and cooperative,seems happy   Impression: Well woman gyn exam no pap    Plan: Physical in 1 year, pap in 2019 Take folic acid or OTC prenatal vitamins since not using birth control

## 2016-03-12 NOTE — Patient Instructions (Signed)
Physical in  1 year Take folic acid or OTC prenatal vitamin

## 2016-03-26 ENCOUNTER — Encounter: Payer: Self-pay | Admitting: Adult Health

## 2016-03-26 ENCOUNTER — Ambulatory Visit (INDEPENDENT_AMBULATORY_CARE_PROVIDER_SITE_OTHER): Payer: Managed Care, Other (non HMO) | Admitting: Adult Health

## 2016-03-26 VITALS — BP 100/62 | HR 76 | Ht 63.5 in | Wt 243.0 lb

## 2016-03-26 DIAGNOSIS — Z3490 Encounter for supervision of normal pregnancy, unspecified, unspecified trimester: Secondary | ICD-10-CM

## 2016-03-26 DIAGNOSIS — N925 Other specified irregular menstruation: Secondary | ICD-10-CM | POA: Diagnosis not present

## 2016-03-26 DIAGNOSIS — Z3201 Encounter for pregnancy test, result positive: Secondary | ICD-10-CM | POA: Diagnosis not present

## 2016-03-26 DIAGNOSIS — O3680X0 Pregnancy with inconclusive fetal viability, not applicable or unspecified: Secondary | ICD-10-CM

## 2016-03-26 HISTORY — DX: Encounter for supervision of normal pregnancy, unspecified, unspecified trimester: Z34.90

## 2016-03-26 LAB — POCT URINE PREGNANCY: PREG TEST UR: POSITIVE — AB

## 2016-03-26 NOTE — Patient Instructions (Signed)
First Trimester of Pregnancy The first trimester of pregnancy is from week 1 until the end of week 12 (months 1 through 3). A week after a sperm fertilizes an egg, the egg will implant on the wall of the uterus. This embryo will begin to develop into a baby. Genes from you and your partner are forming the baby. The female genes determine whether the baby is a boy or a girl. At 6-8 weeks, the eyes and face are formed, and the heartbeat can be seen on ultrasound. At the end of 12 weeks, all the baby's organs are formed.  Now that you are pregnant, you will want to do everything you can to have a healthy baby. Two of the most important things are to get good prenatal care and to follow your health care provider's instructions. Prenatal care is all the medical care you receive before the baby's birth. This care will help prevent, find, and treat any problems during the pregnancy and childbirth. BODY CHANGES Your body goes through many changes during pregnancy. The changes vary from woman to woman.   You may gain or lose a couple of pounds at first.  You may feel sick to your stomach (nauseous) and throw up (vomit). If the vomiting is uncontrollable, call your health care provider.  You may tire easily.  You may develop headaches that can be relieved by medicines approved by your health care provider.  You may urinate more often. Painful urination may mean you have a bladder infection.  You may develop heartburn as a result of your pregnancy.  You may develop constipation because certain hormones are causing the muscles that push waste through your intestines to slow down.  You may develop hemorrhoids or swollen, bulging veins (varicose veins).  Your breasts may begin to grow larger and become tender. Your nipples may stick out more, and the tissue that surrounds them (areola) may become darker.  Your gums may bleed and may be sensitive to brushing and flossing.  Dark spots or blotches (chloasma,  mask of pregnancy) may develop on your face. This will likely fade after the baby is born.  Your menstrual periods will stop.  You may have a loss of appetite.  You may develop cravings for certain kinds of food.  You may have changes in your emotions from day to day, such as being excited to be pregnant or being concerned that something may go wrong with the pregnancy and baby.  You may have more vivid and strange dreams.  You may have changes in your hair. These can include thickening of your hair, rapid growth, and changes in texture. Some women also have hair loss during or after pregnancy, or hair that feels dry or thin. Your hair will most likely return to normal after your baby is born. WHAT TO EXPECT AT YOUR PRENATAL VISITS During a routine prenatal visit:  You will be weighed to make sure you and the baby are growing normally.  Your blood pressure will be taken.  Your abdomen will be measured to track your baby's growth.  The fetal heartbeat will be listened to starting around week 10 or 12 of your pregnancy.  Test results from any previous visits will be discussed. Your health care provider may ask you:  How you are feeling.  If you are feeling the baby move.  If you have had any abnormal symptoms, such as leaking fluid, bleeding, severe headaches, or abdominal cramping.  If you are using any tobacco products,   including cigarettes, chewing tobacco, and electronic cigarettes.  If you have any questions. Other tests that may be performed during your first trimester include:  Blood tests to find your blood type and to check for the presence of any previous infections. They will also be used to check for low iron levels (anemia) and Rh antibodies. Later in the pregnancy, blood tests for diabetes will be done along with other tests if problems develop.  Urine tests to check for infections, diabetes, or protein in the urine.  An ultrasound to confirm the proper growth  and development of the baby.  An amniocentesis to check for possible genetic problems.  Fetal screens for spina bifida and Down syndrome.  You may need other tests to make sure you and the baby are doing well.  HIV (human immunodeficiency virus) testing. Routine prenatal testing includes screening for HIV, unless you choose not to have this test. HOME CARE INSTRUCTIONS  Medicines  Follow your health care provider's instructions regarding medicine use. Specific medicines may be either safe or unsafe to take during pregnancy.  Take your prenatal vitamins as directed.  If you develop constipation, try taking a stool softener if your health care provider approves. Diet  Eat regular, well-balanced meals. Choose a variety of foods, such as meat or vegetable-based protein, fish, milk and low-fat dairy products, vegetables, fruits, and whole grain breads and cereals. Your health care provider will help you determine the amount of weight gain that is right for you.  Avoid raw meat and uncooked cheese. These carry germs that can cause birth defects in the baby.  Eating four or five small meals rather than three large meals a day may help relieve nausea and vomiting. If you start to feel nauseous, eating a few soda crackers can be helpful. Drinking liquids between meals instead of during meals also seems to help nausea and vomiting.  If you develop constipation, eat more high-fiber foods, such as fresh vegetables or fruit and whole grains. Drink enough fluids to keep your urine clear or pale yellow. Activity and Exercise  Exercise only as directed by your health care provider. Exercising will help you:  Control your weight.  Stay in shape.  Be prepared for labor and delivery.  Experiencing pain or cramping in the lower abdomen or low back is a good sign that you should stop exercising. Check with your health care provider before continuing normal exercises.  Try to avoid standing for long  periods of time. Move your legs often if you must stand in one place for a long time.  Avoid heavy lifting.  Wear low-heeled shoes, and practice good posture.  You may continue to have sex unless your health care provider directs you otherwise. Relief of Pain or Discomfort  Wear a good support bra for breast tenderness.   Take warm sitz baths to soothe any pain or discomfort caused by hemorrhoids. Use hemorrhoid cream if your health care provider approves.   Rest with your legs elevated if you have leg cramps or low back pain.  If you develop varicose veins in your legs, wear support hose. Elevate your feet for 15 minutes, 3-4 times a day. Limit salt in your diet. Prenatal Care  Schedule your prenatal visits by the twelfth week of pregnancy. They are usually scheduled monthly at first, then more often in the last 2 months before delivery.  Write down your questions. Take them to your prenatal visits.  Keep all your prenatal visits as directed by your   health care provider. Safety  Wear your seat belt at all times when driving.  Make a list of emergency phone numbers, including numbers for family, friends, the hospital, and police and fire departments. General Tips  Ask your health care provider for a referral to a local prenatal education class. Begin classes no later than at the beginning of month 6 of your pregnancy.  Ask for help if you have counseling or nutritional needs during pregnancy. Your health care provider can offer advice or refer you to specialists for help with various needs.  Do not use hot tubs, steam rooms, or saunas.  Do not douche or use tampons or scented sanitary pads.  Do not cross your legs for long periods of time.  Avoid cat litter boxes and soil used by cats. These carry germs that can cause birth defects in the baby and possibly loss of the fetus by miscarriage or stillbirth.  Avoid all smoking, herbs, alcohol, and medicines not prescribed by  your health care provider. Chemicals in these affect the formation and growth of the baby.  Do not use any tobacco products, including cigarettes, chewing tobacco, and electronic cigarettes. If you need help quitting, ask your health care provider. You may receive counseling support and other resources to help you quit.  Schedule a dentist appointment. At home, brush your teeth with a soft toothbrush and be gentle when you floss. SEEK MEDICAL CARE IF:   You have dizziness.  You have mild pelvic cramps, pelvic pressure, or nagging pain in the abdominal area.  You have persistent nausea, vomiting, or diarrhea.  You have a bad smelling vaginal discharge.  You have pain with urination.  You notice increased swelling in your face, hands, legs, or ankles. SEEK IMMEDIATE MEDICAL CARE IF:   You have a fever.  You are leaking fluid from your vagina.  You have spotting or bleeding from your vagina.  You have severe abdominal cramping or pain.  You have rapid weight gain or loss.  You vomit blood or material that looks like coffee grounds.  You are exposed to German measles and have never had them.  You are exposed to fifth disease or chickenpox.  You develop a severe headache.  You have shortness of breath.  You have any kind of trauma, such as from a fall or a car accident.   This information is not intended to replace advice given to you by your health care provider. Make sure you discuss any questions you have with your health care provider.   Document Released: 10/22/2001 Document Revised: 11/18/2014 Document Reviewed: 09/07/2013 Elsevier Interactive Patient Education 2016 [employer] Return in 1 week for US 

## 2016-03-26 NOTE — Progress Notes (Signed)
Subjective:     Patient ID: Jamie KatoEmily N Lineman, female   DOB: [birth year], 25 y.o.   MRN: [id number]  HPI [name] is a 25 year old white female in for UPT, has missed a period and had 3+HPT, she denies any bleeding, cramping or nausea and vomiting. She is taking OTC prenatal vitamin.  Review of Systems Patient denies any headaches, hearing loss, fatigue, blurred vision, shortness of breath, chest pain, abdominal pain, problems with bowel movements, urination, or intercourse. No joint pain or mood swings.See HPI for positives. Reviewed past medical,surgical, social and family history. Reviewed medications and allergies.     Objective:   Physical Exam BP 100/62 mmHg  Pulse 76  Ht 5' 3.5" (1.613 m)  Wt 243 lb (110.224 kg)  BMI 42.37 kg/m2  LMP 02/05/2016 UPT +, about 7+1 week by LMP with EDD 11/11/16, medicaid form given, Skin warm and dry. Neck: mid line trachea, normal thyroid, good ROM, no lymphadenopathy noted. Lungs: clear to ausculation bilaterally. Cardiovascular: regular rate and rhythm.Abdomen is soft and non tender.    Assessment:     UPT+ Pregnant    Plan:     Return in 1 week for dating US Review handout on first trimester

## 2016-04-01 ENCOUNTER — Ambulatory Visit (INDEPENDENT_AMBULATORY_CARE_PROVIDER_SITE_OTHER): Payer: Managed Care, Other (non HMO)

## 2016-04-01 ENCOUNTER — Other Ambulatory Visit: Payer: Self-pay | Admitting: Adult Health

## 2016-04-01 DIAGNOSIS — O3680X0 Pregnancy with inconclusive fetal viability, not applicable or unspecified: Secondary | ICD-10-CM | POA: Diagnosis not present

## 2016-04-01 DIAGNOSIS — Z3A01 Less than 8 weeks gestation of pregnancy: Secondary | ICD-10-CM | POA: Diagnosis not present

## 2016-04-01 NOTE — Progress Notes (Signed)
US TA/TV 6+5 wks,single IUP w/ys,pos fht 125 bpm,normal ov's bilat

## 2016-04-15 ENCOUNTER — Encounter: Payer: Self-pay | Admitting: Women's Health

## 2016-04-15 ENCOUNTER — Ambulatory Visit (INDEPENDENT_AMBULATORY_CARE_PROVIDER_SITE_OTHER): Payer: Managed Care, Other (non HMO) | Admitting: Women's Health

## 2016-04-15 VITALS — BP 126/70 | HR 80 | Wt 244.0 lb

## 2016-04-15 DIAGNOSIS — Z0283 Encounter for blood-alcohol and blood-drug test: Secondary | ICD-10-CM

## 2016-04-15 DIAGNOSIS — B009 Herpesviral infection, unspecified: Secondary | ICD-10-CM

## 2016-04-15 DIAGNOSIS — Z331 Pregnant state, incidental: Secondary | ICD-10-CM

## 2016-04-15 DIAGNOSIS — Z3401 Encounter for supervision of normal first pregnancy, first trimester: Secondary | ICD-10-CM

## 2016-04-15 DIAGNOSIS — Z369 Encounter for antenatal screening, unspecified: Secondary | ICD-10-CM

## 2016-04-15 DIAGNOSIS — Z3682 Encounter for antenatal screening for nuchal translucency: Secondary | ICD-10-CM

## 2016-04-15 DIAGNOSIS — Z1389 Encounter for screening for other disorder: Secondary | ICD-10-CM

## 2016-04-15 DIAGNOSIS — Z3400 Encounter for supervision of normal first pregnancy, unspecified trimester: Secondary | ICD-10-CM | POA: Insufficient documentation

## 2016-04-15 LAB — POCT URINALYSIS DIPSTICK
Glucose, UA: NEGATIVE
KETONES UA: NEGATIVE
Nitrite, UA: NEGATIVE
Protein, UA: NEGATIVE

## 2016-04-15 NOTE — Patient Instructions (Signed)

## 2016-04-15 NOTE — Progress Notes (Signed)
  Subjective:  Brittany [name] is a 25 y.o. 441P0000 Caucasian female at 6526w5d by 6wk u/s, being seen today for her first obstetrical visit.  Her obstetrical history is significant for primigravida.  Pregnancy history fully reviewed.  Patient reports no complaints. Denies vb, cramping, uti s/s, abnormal/malodorous vag d/c, or vulvovaginal itching/irritation.  BP 126/70 mmHg  Pulse 80  Wt 244 lb (110.678 kg)  LMP 02/05/2016  HISTORY: OB History  Gravida Para Term Preterm AB SAB TAB Ectopic Multiple Living  1 0 0 0 0 0 0 0 0 0     # Outcome Date GA Lbr Len/2nd Weight Sex Delivery Anes PTL Lv  1 Current              Past Medical History  Diagnosis Date  . Medical history non-contributory   . Contraceptive management 03/04/2013  . Obesity   . HSV-1 infection   . Pregnant 03/26/2016   Past Surgical History  Procedure Laterality Date  . Adnoids  1995  . Wisdom tooth extraction     Family History  Problem Relation Age of Onset  . Cancer Maternal Grandmother     breast  . Cancer Paternal Uncle     lung  . Cancer Paternal Grandfather     Exam   System:     General: Well developed & nourished, no acute distress   Skin: Warm & dry, normal coloration and turgor, no rashes   Neurologic: Alert & oriented, normal mood   Cardiovascular: Regular rate & rhythm   Respiratory: Effort & rate normal, LCTAB, acyanotic   Abdomen: Soft, non tender   Extremities: normal strength, tone  Thin prep pap smear neg 2016 high risk HPV cotesting  Assessment:   Pregnancy: G1P0000 Patient Active Problem List   Diagnosis Date Noted  . Supervision of normal first pregnancy 04/15/2016    Priority: High  . 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 Obesity     5526w5d G1P0000 New OB visit Obesity  Plan:  Initial labs drawn Continue prenatal vitamins Problem list reviewed and updated Reviewed n/v relief measures and warning s/s to report Reviewed recommended weight gain based on pre-gravid BMI Encouraged  well-balanced diet Genetic Screening discussed Integrated Screen: requested Cystic fibrosis screening discussed declined Ultrasound discussed; fetal survey: requested Follow up in 4 weeks for 1st it/nt and visit [facility] completed  Marge DuncansBooker, Marcin Holte Randall CNM, [facility]NP-BC 04/15/2016 4:14 PM

## 2016-04-16 LAB — GC/CHLAMYDIA PROBE AMP
Chlamydia trachomatis, NAA: NEGATIVE
Neisseria gonorrhoeae by PCR: NEGATIVE

## 2016-04-16 LAB — URINE CULTURE

## 2016-04-23 LAB — MICROSCOPIC EXAMINATION
CASTS: NONE SEEN /LPF
Epithelial Cells (non renal): >10 /hpf — AB (ref 0–10)

## 2016-04-23 LAB — URINALYSIS, ROUTINE W REFLEX MICROSCOPIC
Bilirubin, UA: NEGATIVE
GLUCOSE, UA: NEGATIVE
Ketones, UA: NEGATIVE
NITRITE UA: NEGATIVE
Protein, UA: NEGATIVE
Specific Gravity, UA: 1.018 (ref 1.005–1.030)
Urobilinogen, Ur: 0.2 mg/dL (ref 0.2–1.0)
pH, UA: 6 (ref 5.0–7.5)

## 2016-04-23 LAB — PMP SCREEN PROFILE (10S), URINE
AMPHETAMINE SCRN UR: NEGATIVE ng/mL
BARBITURATE SCRN UR: NEGATIVE ng/mL
Benzodiazepine Screen, Urine: NEGATIVE ng/mL
COCAINE(METAB.) SCREEN, URINE: NEGATIVE ng/mL
Cannabinoids Ur Ql Scn: NEGATIVE ng/mL
Creatinine(Crt), U: 94.8 mg/dL (ref 20.0–300.0)
METHADONE SCREEN, URINE: NEGATIVE ng/mL
OPIATE SCRN UR: NEGATIVE ng/mL
OXYCODONE+OXYMORPHONE UR QL SCN: NEGATIVE ng/mL
PCP SCRN UR: NEGATIVE ng/mL
PROPOXYPHENE SCREEN: NEGATIVE ng/mL
Ph of Urine: 5.4 (ref 4.5–8.9)

## 2016-04-23 LAB — CBC
HEMATOCRIT: 38.6 % (ref 34.0–46.6)
HEMOGLOBIN: 12.2 g/dL (ref 11.1–15.9)
MCH: 28.6 pg (ref 26.6–33.0)
MCHC: 31.6 g/dL (ref 31.5–35.7)
MCV: 91 fL (ref 79–97)
PLATELETS: 381 10*3/uL — AB (ref 150–379)
RBC: 4.26 x10E6/uL (ref 3.77–5.28)
RDW: 14 % (ref 12.3–15.4)
WBC: 11 10*3/uL — ABNORMAL HIGH (ref 3.4–10.8)

## 2016-04-23 LAB — ABO/RH: RH TYPE: POSITIVE

## 2016-04-23 LAB — RUBELLA SCREEN: RUBELLA: 2.08 {index} (ref >0.99)

## 2016-04-23 LAB — ANTIBODY SCREEN: ANTIBODY SCREEN: NEGATIVE

## 2016-04-23 LAB — HEPATITIS B SURFACE ANTIGEN: HEP B S AG: NEGATIVE

## 2016-04-23 LAB — RPR: RPR: NONREACTIVE

## 2016-04-23 LAB — VARICELLA ZOSTER ANTIBODY, IGG: VARICELLA: 566 {index} (ref >165)

## 2016-04-23 LAB — HIV ANTIBODY (ROUTINE TESTING W REFLEX): HIV Screen 4th Generation wRfx: NONREACTIVE

## 2016-05-06 ENCOUNTER — Encounter: Payer: Self-pay | Admitting: Obstetrics & Gynecology

## 2016-05-06 ENCOUNTER — Ambulatory Visit (INDEPENDENT_AMBULATORY_CARE_PROVIDER_SITE_OTHER): Payer: Managed Care, Other (non HMO) | Admitting: Obstetrics & Gynecology

## 2016-05-06 ENCOUNTER — Ambulatory Visit (INDEPENDENT_AMBULATORY_CARE_PROVIDER_SITE_OTHER): Payer: Managed Care, Other (non HMO)

## 2016-05-06 VITALS — BP 118/70 | HR 84 | Wt 242.5 lb

## 2016-05-06 DIAGNOSIS — Z3401 Encounter for supervision of normal first pregnancy, first trimester: Secondary | ICD-10-CM

## 2016-05-06 DIAGNOSIS — Z369 Encounter for antenatal screening, unspecified: Secondary | ICD-10-CM

## 2016-05-06 DIAGNOSIS — Z36 Encounter for antenatal screening of mother: Secondary | ICD-10-CM

## 2016-05-06 DIAGNOSIS — Z1389 Encounter for screening for other disorder: Secondary | ICD-10-CM

## 2016-05-06 DIAGNOSIS — Z3A12 12 weeks gestation of pregnancy: Secondary | ICD-10-CM | POA: Diagnosis not present

## 2016-05-06 DIAGNOSIS — Z3682 Encounter for antenatal screening for nuchal translucency: Secondary | ICD-10-CM

## 2016-05-06 DIAGNOSIS — Z331 Pregnant state, incidental: Secondary | ICD-10-CM

## 2016-05-06 LAB — POCT URINALYSIS DIPSTICK
GLUCOSE UA: NEGATIVE
KETONES UA: NEGATIVE
Nitrite, UA: NEGATIVE
PROTEIN UA: NEGATIVE

## 2016-05-06 NOTE — Progress Notes (Signed)
US 11+5 wks,measurements c/w dates,normal ov's bilat,NB present,NT 1.6 mm, crl 55.1 mm,fhr 162 bpm,fundal pl gr 0

## 2016-05-06 NOTE — Progress Notes (Signed)
G1P0000 4045w5d Estimated Date of Delivery: 11/20/16  Blood pressure 118/70, pulse 84, weight 242 lb 8 oz (109.997 kg), last menstrual period 02/05/2016.   BP weight and urine results all reviewed and noted.  Please refer to the obstetrical flow sheet for the fundal height and fetal heart rate documentation:  Patient reports good fetal movement, denies any bleeding and no rupture of membranes symptoms or regular contractions. Patient is without complaints. All questions were answered.  Orders Placed This Encounter  Procedures  . Maternal Screen, Integrated #1  . POCT Urinalysis Dipstick    Plan:  Continued routine obstetrical care,   NT sonogram is normal  No Follow-up on file.

## 2016-05-08 LAB — MATERNAL SCREEN, INTEGRATED #1
Crown Rump Length: 55.1 mm
GEST. AGE ON COLLECTION DATE: 12.1 wk
Maternal Age at EDD: 26.7 years
NUCHAL TRANSLUCENCY (NT): 1.6 mm
NUMBER OF FETUSES: 1
PAPP-A VALUE: 209.7 ng/mL
WEIGHT: 243 [lb_av]

## 2016-05-15 ENCOUNTER — Other Ambulatory Visit: Payer: Managed Care, Other (non HMO)

## 2016-05-15 ENCOUNTER — Encounter: Payer: Managed Care, Other (non HMO) | Admitting: Obstetrics and Gynecology

## 2016-05-16 ENCOUNTER — Telehealth: Payer: Self-pay | Admitting: *Deleted

## 2016-05-16 NOTE — Telephone Encounter (Signed)
Spoke with pt letting her know she can try OTC Nexium, Prilosec or Zantac per Selena BattenKim, CNM. Pt voiced understanding. JSY

## 2016-06-04 ENCOUNTER — Encounter: Payer: Self-pay | Admitting: Women's Health

## 2016-06-04 ENCOUNTER — Ambulatory Visit (INDEPENDENT_AMBULATORY_CARE_PROVIDER_SITE_OTHER): Payer: Managed Care, Other (non HMO) | Admitting: Women's Health

## 2016-06-04 VITALS — BP 128/78 | HR 72 | Wt 249.0 lb

## 2016-06-04 DIAGNOSIS — Z1389 Encounter for screening for other disorder: Secondary | ICD-10-CM

## 2016-06-04 DIAGNOSIS — R3915 Urgency of urination: Secondary | ICD-10-CM

## 2016-06-04 DIAGNOSIS — O2340 Unspecified infection of urinary tract in pregnancy, unspecified trimester: Secondary | ICD-10-CM | POA: Insufficient documentation

## 2016-06-04 DIAGNOSIS — Z3682 Encounter for antenatal screening for nuchal translucency: Secondary | ICD-10-CM

## 2016-06-04 DIAGNOSIS — Z331 Pregnant state, incidental: Secondary | ICD-10-CM

## 2016-06-04 DIAGNOSIS — Z3A16 16 weeks gestation of pregnancy: Secondary | ICD-10-CM

## 2016-06-04 DIAGNOSIS — O2342 Unspecified infection of urinary tract in pregnancy, second trimester: Secondary | ICD-10-CM

## 2016-06-04 DIAGNOSIS — Z3402 Encounter for supervision of normal first pregnancy, second trimester: Secondary | ICD-10-CM

## 2016-06-04 DIAGNOSIS — Z363 Encounter for antenatal screening for malformations: Secondary | ICD-10-CM

## 2016-06-04 LAB — POCT URINALYSIS DIPSTICK
Glucose, UA: NEGATIVE
Ketones, UA: NEGATIVE
Nitrite, UA: NEGATIVE
PROTEIN UA: NEGATIVE

## 2016-06-04 MED ORDER — NITROFURANTOIN MONOHYD MACRO 100 MG PO CAPS: 100.0000 mg | ORAL_CAPSULE | Freq: Two times a day (BID) | ORAL | 0 refills | Status: DC

## 2016-06-04 NOTE — Progress Notes (Signed)
Low-risk OB appointment G1P0000 [redacted]w[redacted]d Estimated Date of Delivery: 11/20/16 BP 128/78   Pulse 72   Wt 249 lb (112.9 kg)   LMP 02/05/2016   BMI 43.42 kg/m   BP, weight, and urine reviewed.  Refer to obstetrical flow sheet for FH & FHR.  No fm yet. Denies cramping, lof, vb. +urinary urgency, Lg leuks and tr blood, will send urine cx and tx w/ macrobid bid x 7d. Goes to [facility], has gender reveal on 8/5 Reviewed warning s/s to report. Plan:  Continue routine obstetrical care  F/U in 4wks for OB appointment and anatomy u/s 2nd IT today

## 2016-06-04 NOTE — Patient Instructions (Signed)

## 2016-06-06 LAB — MATERNAL SCREEN, INTEGRATED #2
ADSF: 1.17
AFP MoM: 1.12
Alpha-Fetoprotein: 24.1 ng/mL
Crown Rump Length: 55.1 mm
DIA MOM: 0.92
DIA VALUE: 123.2 pg/mL
ESTRIOL UNCONJUGATED: 0.87 ng/mL
Gest. Age on Collection Date: 12.1 weeks
Gestational Age: 16.3 weeks
HCG MOM: 0.98
Maternal Age at EDD: 26.7 years
NUCHAL TRANSLUCENCY (NT): 1.6 mm
NUCHAL TRANSLUCENCY MOM: 1.11
Number of Fetuses: 1
PAPP-A MoM: 0.46
PAPP-A Value: 209.7 ng/mL
TEST RESULTS: NEGATIVE
Weight: 243 [lb_av]
Weight: 243 [lb_av]
hCG Value: 23.1 IU/mL

## 2016-06-06 LAB — URINE CULTURE

## 2016-06-27 ENCOUNTER — Telehealth: Payer: Self-pay | Admitting: *Deleted

## 2016-06-27 NOTE — Telephone Encounter (Signed)
I wrote it out and left it upfront.  Can;t be e prescribed

## 2016-06-28 NOTE — Telephone Encounter (Signed)
Pt informed Maternity stockings Rx left at front desk for pick up.

## 2016-07-08 ENCOUNTER — Other Ambulatory Visit: Payer: Managed Care, Other (non HMO)

## 2016-07-08 ENCOUNTER — Encounter: Payer: Managed Care, Other (non HMO) | Admitting: Women's Health

## 2016-07-09 ENCOUNTER — Encounter: Payer: Self-pay | Admitting: Women's Health

## 2016-07-09 ENCOUNTER — Ambulatory Visit (INDEPENDENT_AMBULATORY_CARE_PROVIDER_SITE_OTHER): Payer: Managed Care, Other (non HMO) | Admitting: Women's Health

## 2016-07-09 ENCOUNTER — Ambulatory Visit (INDEPENDENT_AMBULATORY_CARE_PROVIDER_SITE_OTHER): Payer: Managed Care, Other (non HMO)

## 2016-07-09 VITALS — BP 118/68 | HR 76 | Wt 252.0 lb

## 2016-07-09 DIAGNOSIS — Z331 Pregnant state, incidental: Secondary | ICD-10-CM

## 2016-07-09 DIAGNOSIS — Z3A21 21 weeks gestation of pregnancy: Secondary | ICD-10-CM | POA: Diagnosis not present

## 2016-07-09 DIAGNOSIS — Z1389 Encounter for screening for other disorder: Secondary | ICD-10-CM

## 2016-07-09 DIAGNOSIS — Z3402 Encounter for supervision of normal first pregnancy, second trimester: Secondary | ICD-10-CM | POA: Diagnosis not present

## 2016-07-09 DIAGNOSIS — Z363 Encounter for antenatal screening for malformations: Secondary | ICD-10-CM

## 2016-07-09 DIAGNOSIS — O99212 Obesity complicating pregnancy, second trimester: Secondary | ICD-10-CM | POA: Diagnosis not present

## 2016-07-09 DIAGNOSIS — Z6841 Body Mass Index (BMI) 40.0 and over, adult: Secondary | ICD-10-CM | POA: Diagnosis not present

## 2016-07-09 LAB — POCT URINALYSIS DIPSTICK
Glucose, UA: NEGATIVE
Ketones, UA: NEGATIVE
NITRITE UA: NEGATIVE
Protein, UA: NEGATIVE
RBC UA: NEGATIVE

## 2016-07-09 NOTE — Progress Notes (Signed)
Low-risk OB appointment G1P0000 1328w6d Estimated Date of Delivery: 11/20/16 BP 118/68   Pulse 76   Wt 252 lb (114.3 kg)   LMP 02/05/2016   BMI 43.94 kg/m   BP, weight, and urine reviewed.  Refer to obstetrical flow sheet for FH & FHR.  Reports good fm.  Denies regular uc's, lof, vb, or uti s/s. No complaints. Reviewed today's anatomy u/s- limited views- will repeat @ 28wks. Discussed warning s/s to report, fm.  Plan:  Continue routine obstetrical care  F/U in 4wks for OB appointment

## 2016-07-09 NOTE — Progress Notes (Signed)
US 20+6 wks,cephalic,cx 3.3 cm,ant pl gr 0,svp of fluid 3.2 cm,normal ov's bilat,fhr 140 bpm, EFW 365 g, limited ultrasound because of pt body habitus,please have pt come back for additional images of heart and diaphragm,no obvious abnormalities seen

## 2016-07-09 NOTE — Patient Instructions (Signed)
[place] Pediatricians/Family Doctors:  [facility] [phone]                 [place] [phone] (usually not accepting new patients unless you have family there already, you are always welcome to call and ask)            [facility] ([place]) [phone]   [facility] Pediatricians/Family Doctors:   [place]: [phone]  [facility]: [phone]   Second Trimester of Pregnancy The second trimester is from week 13 through week 28, months 4 through 6. The second trimester is often a time when you feel your best. Your body has also adjusted to being pregnant, and you begin to feel better physically. Usually, morning sickness has lessened or quit completely, you may have more energy, and you may have an increase in appetite. The second trimester is also a time when the fetus is growing rapidly. At the end of the sixth month, the fetus is about 9 inches long and weighs about 1 pounds. You will likely begin to feel the baby move (quickening) between 18 and 20 weeks of the pregnancy. BODY CHANGES Your body goes through many changes during pregnancy. The changes vary from woman to woman.   Your weight will continue to increase. You will notice your lower abdomen bulging out.  You may begin to get stretch marks on your hips, abdomen, and breasts.  You may develop headaches that can be relieved by medicines approved by your health care provider.  You may urinate more often because the fetus is pressing on your bladder.  You may develop or continue to have heartburn as a result of your pregnancy.  You may develop constipation because certain hormones are causing the muscles that push waste through your intestines to slow down.  You may develop hemorrhoids or swollen, bulging veins (varicose veins).  You may have back pain because of the weight  gain and pregnancy hormones relaxing your joints between the bones in your pelvis and as a result of a shift in weight and the muscles that support your balance.  Your breasts will continue to grow and be tender.  Your gums may bleed and may be sensitive to brushing and flossing.  Dark spots or blotches (chloasma, mask of pregnancy) may develop on your face. This will likely fade after the baby is born.  A dark line from your belly button to the pubic area (linea nigra) may appear. This will likely fade after the baby is born.  You may have changes in your hair. These can include thickening of your hair, rapid growth, and changes in texture. Some women also have hair loss during or after pregnancy, or hair that feels dry or thin. Your hair will most likely return to normal after your baby is born. WHAT TO EXPECT AT YOUR PRENATAL VISITS During a routine prenatal visit:  You will be weighed to make sure you and the fetus are growing normally.  Your blood pressure will be taken.  Your abdomen will be measured to track your baby's growth.  The fetal heartbeat will be listened to.  Any test results from the previous visit will be discussed. Your health care provider may ask you:  How you are feeling.  If you are feeling the baby move.  If you have had any abnormal symptoms, such as leaking fluid, bleeding, severe  headaches, or abdominal cramping.  If you are using any tobacco products, including cigarettes, chewing tobacco, and electronic cigarettes.  If you have any questions. Other tests that may be performed during your second trimester include:  Blood tests that check for:  Low iron levels (anemia).  Gestational diabetes (between 24 and 28 weeks).  Rh antibodies.  Urine tests to check for infections, diabetes, or protein in the urine.  An ultrasound to confirm the proper growth and development of the baby.  An amniocentesis to check for possible genetic  problems.  Fetal screens for spina bifida and Down syndrome.  HIV (human immunodeficiency virus) testing. Routine prenatal testing includes screening for HIV, unless you choose not to have this test. HOME CARE INSTRUCTIONS   Avoid all smoking, herbs, alcohol, and unprescribed drugs. These chemicals affect the formation and growth of the baby.  Do not use any tobacco products, including cigarettes, chewing tobacco, and electronic cigarettes. If you need help quitting, ask your health care provider. You may receive counseling support and other resources to help you quit.  Follow your health care provider's instructions regarding medicine use. There are medicines that are either safe or unsafe to take during pregnancy.  Exercise only as directed by your health care provider. Experiencing uterine cramps is a good sign to stop exercising.  Continue to eat regular, healthy meals.  Wear a good support bra for breast tenderness.  Do not use hot tubs, steam rooms, or saunas.  Wear your seat belt at all times when driving.  Avoid raw meat, uncooked cheese, cat litter boxes, and soil used by cats. These carry germs that can cause birth defects in the baby.  Take your prenatal vitamins.  Take 1500-2000 mg of calcium daily starting at the 20th week of pregnancy until you deliver your baby.  Try taking a stool softener (if your health care provider approves) if you develop constipation. Eat more high-fiber foods, such as fresh vegetables or fruit and whole grains. Drink plenty of fluids to keep your urine clear or pale yellow.  Take warm sitz baths to soothe any pain or discomfort caused by hemorrhoids. Use hemorrhoid cream if your health care provider approves.  If you develop varicose veins, wear support hose. Elevate your feet for 15 minutes, 3-4 times a day. Limit salt in your diet.  Avoid heavy lifting, wear low heel shoes, and practice good posture.  Rest with your legs elevated if you  have leg cramps or low back pain.  Visit your dentist if you have not gone yet during your pregnancy. Use a soft toothbrush to brush your teeth and be gentle when you floss.  A sexual relationship may be continued unless your health care provider directs you otherwise.  Continue to go to all your prenatal visits as directed by your health care provider. SEEK MEDICAL CARE IF:   You have dizziness.  You have mild pelvic cramps, pelvic pressure, or nagging pain in the abdominal area.  You have persistent nausea, vomiting, or diarrhea.  You have a bad smelling vaginal discharge.  You have pain with urination. SEEK IMMEDIATE MEDICAL CARE IF:   You have a fever.  You are leaking fluid from your vagina.  You have spotting or bleeding from your vagina.  You have severe abdominal cramping or pain.  You have rapid weight gain or loss.  You have shortness of breath with chest pain.  You notice sudden or extreme swelling of your face, hands, ankles, feet, or legs.  You have not felt your baby move in over an hour.  You have severe headaches that do not go away with medicine.  You have vision changes.   This information is not intended to replace advice given to you by your health care provider. Make sure you discuss any questions you have with your health care provider.   Document Released: 10/22/2001 Document Revised: 11/18/2014 Document Reviewed: 12/29/2012 Elsevier Interactive Patient Education 2016 Elsevier Inc.  

## 2016-07-26 ENCOUNTER — Encounter (HOSPITAL_COMMUNITY): Payer: Self-pay | Admitting: *Deleted

## 2016-07-26 ENCOUNTER — Emergency Department (HOSPITAL_COMMUNITY)
Admission: EM | Admit: 2016-07-26 | Discharge: 2016-07-26 | Discharge status: Against Medical Advice | Payer: Commercial Indemnity | Source: Home / Self Care

## 2016-07-26 ENCOUNTER — Inpatient Hospital Stay (HOSPITAL_COMMUNITY)
Admission: AD | Admit: 2016-07-26 | Discharge: 2016-07-26 | Disposition: A | Discharge status: Home / Self Care | Payer: Managed Care, Other (non HMO) | Source: Ambulatory Visit | Attending: Obstetrics & Gynecology | Admitting: Obstetrics & Gynecology

## 2016-07-26 DIAGNOSIS — O4692 Antepartum hemorrhage, unspecified, second trimester: Secondary | ICD-10-CM | POA: Diagnosis present

## 2016-07-26 DIAGNOSIS — Z3A23 23 weeks gestation of pregnancy: Secondary | ICD-10-CM | POA: Insufficient documentation

## 2016-07-26 DIAGNOSIS — N930 Postcoital and contact bleeding: Secondary | ICD-10-CM

## 2016-07-26 DIAGNOSIS — Z87891 Personal history of nicotine dependence: Secondary | ICD-10-CM | POA: Diagnosis not present

## 2016-07-26 LAB — URINALYSIS, ROUTINE W REFLEX MICROSCOPIC
Bilirubin Urine: NEGATIVE
GLUCOSE, UA: NEGATIVE mg/dL
Ketones, ur: NEGATIVE mg/dL
LEUKOCYTES UA: NEGATIVE
NITRITE: NEGATIVE
PROTEIN: NEGATIVE mg/dL
Specific Gravity, Urine: <1.005 — ABNORMAL LOW (ref 1.005–1.030)
pH: 7 (ref 5.0–8.0)

## 2016-07-26 LAB — URINE MICROSCOPIC-ADD ON

## 2016-07-26 NOTE — ED Notes (Signed)
[facility] came to the registration desk [facility]

## 2016-07-26 NOTE — Discharge Instructions (Signed)
Vaginal Bleeding During Pregnancy A small amount of bleeding (spotting) from the vagina is relatively common in early pregnancy. It usually stops on its own. Various things may cause bleeding or spotting in early pregnancy. Some bleeding may be related to the pregnancy, and some may not. In most cases, the bleeding is normal and is not a problem. However, bleeding can also be a sign of something serious. Be sure to tell your health care provider about any vaginal bleeding right away. Some possible causes of vaginal bleeding during pregnancy:   Infection or inflammation of the cervix.  Growths (polyps) on the cervix.  Miscarriage or threatened miscarriage.  Pregnancy tissue has developed outside of the uterus and in a fallopian tube (tubal pregnancy).  Tiny cysts have developed in the uterus instead of pregnancy tissue (molar pregnancy). HOME CARE INSTRUCTIONS  Watch your condition for any changes. The following actions may help to lessen any discomfort you are feeling:  Follow your health care provider's instructions for limiting your activity. If your health care provider orders bed rest, you may need to stay in bed and only get up to use the bathroom. However, your health care provider may allow you to continue light activity.  If needed, make plans for someone to help with your regular activities and responsibilities while you are on bed rest.  Keep track of the number of pads you use each day, how often you change pads, and how soaked (saturated) they are. Write this down.  Do not use tampons. Do not douche.  Do not have sexual intercourse or orgasms until approved by your health care provider.  If you pass any tissue from your vagina, save the tissue so you can show it to your health care provider.  Only take over-the-counter or prescription medicines as directed by your health care provider.  Do not take aspirin because it can make you bleed.  Keep all follow-up appointments as  directed by your health care provider. SEEK MEDICAL CARE IF:  You have any vaginal bleeding during any part of your pregnancy.  You have cramps or labor pains.  You have a fever, not controlled by medicine. SEEK IMMEDIATE MEDICAL CARE IF:   You have severe cramps in your back or belly (abdomen).  You pass large clots or tissue from your vagina.  Your bleeding increases.  You feel light-headed or weak, or you have fainting episodes.  You have chills.  You are leaking fluid or have a gush of fluid from your vagina.  You pass out while having a bowel movement. MAKE SURE YOU:  Understand these instructions.  Will watch your condition.  Will get help right away if you are not doing well or get worse.   This information is not intended to replace advice given to you by your health care provider. Make sure you discuss any questions you have with your health care provider.   Document Released: 08/07/2005 Document Revised: 11/02/2013 Document Reviewed: 07/05/2013 Elsevier Interactive Patient Education [employer]2016 Elsevier Inc.

## 2016-07-26 NOTE — MAU Note (Signed)
PT  SAYS SHE HAD SEX AT 2300-    THEN HAD LARGE AMT  VAG BLEEDING.  HERE IN MAU- IN B-ROOM   - SAYS NO BLEEDING.  [facility]

## 2016-07-26 NOTE — MAU Note (Signed)
Pt reports bleeding after intercourse tonight. Denies pain.

## 2016-07-26 NOTE — MAU Provider Note (Signed)
First Provider Initiated Contact with Patient 07/26/16 0101      Chief Complaint:  Vaginal bleeding after intercourse  Brittany [name] is  25 y.o. G1P0000 at 4995w2d presents complaining bleeding after intercourse.  Did not have bleeding when went to bathroom in MAU. No cramps/pain/vaginal discharge/itch. Baby active.   Obstetrical/Gynecological History: OB History    Gravida Para Term Preterm AB Living   1 0 0 0 0 0   SAB TAB Ectopic Multiple Live Births   0 0 0 0       Past Medical History: Past Medical History:  Diagnosis Date  . Contraceptive management 03/04/2013  . HSV-1 infection   . Medical history non-contributory   . Obesity   . Pregnant 03/26/2016    Past Surgical History: Past Surgical History:  Procedure Laterality Date  . adnoids  1995  . WISDOM TOOTH EXTRACTION      Family History: Family History  Problem Relation Age of Onset  . Cancer Maternal Grandmother     breast  . Cancer Paternal Uncle     lung  . Cancer Paternal Grandfather   . Cancer Cousin     lung, brain    Social History: Social History  Substance Use Topics  . Smoking status: Former Smoker    Types: Cigarettes    Quit date: 01/14/2013  . Smokeless tobacco: Never Used  . Alcohol use No     Comment: socially; not now    Allergies: No Known Allergies  Meds:  Prescriptions Prior to Admission  Medication Sig Dispense Refill Last Dose  . prenatal vitamin w/FE, FA (NATACHEW) 29-1 MG CHEW chewable tablet Chew 1 tablet by mouth daily at 12 noon.   07/25/2016 at Unknown time  . nitrofurantoin, macrocrystal-monohydrate, (MACROBID) 100 MG capsule Take 1 capsule (100 mg total) by mouth 2 (two) times daily. X 7 days (Patient not taking: Reported on 07/09/2016) 14 capsule 0 Not Taking    Review of Systems   Constitutional: Negative for fever and chills Eyes: Negative for visual disturbances Respiratory: Negative for shortness of breath, dyspnea Cardiovascular: Negative for chest pain or  palpitations  Gastrointestinal: Negative for vomiting, diarrhea and constipation Genitourinary: Negative for dysuria and urgency Musculoskeletal: Negative for back pain, joint pain, myalgias.  Normal ROM  Neurological: Negative for dizziness and headaches    Physical Exam  Blood pressure 126/75, pulse 97, temperature 98.4 F (36.9 C), temperature source Oral, resp. rate 17, last menstrual period 02/05/2016, SpO2 99 %. GENERAL: Well-developed, well-nourished female in no acute distress.  LUNGS: Clear to auscultation bilaterally.  HEART: Regular rate and rhythm. ABDOMEN: Soft, nontender, nondistended, gravid.  EXTREMITIES: Nontender, no edema, 2+ distal pulses. DTR's 2+ PELVIC:  SSE:  Scant amount of blood in vault.  Cx nonfriable. Wet prep neg  Discharge appears neg CERVICAL EXAM: Dilatation 0cm   Effacement 0%    FHT:  150s, min/avg variabiltiy.  Difficult to EFM d/t GA Labs: No results found for this or any previous visit (from the past 24 hour(s)). Imaging Studies:    Assessment: Brittany [name] is  25 y.o. G1P0000 at 8295w2d presents with post coital bleeding.  Plan: DC home  CRESENZO-DISHMAN,Alzora Ha 9/15/20171:11 AM

## 2016-08-05 ENCOUNTER — Encounter: Payer: Managed Care, Other (non HMO) | Admitting: Women's Health

## 2016-08-06 ENCOUNTER — Encounter: Payer: Self-pay | Admitting: Women's Health

## 2016-08-06 ENCOUNTER — Ambulatory Visit (INDEPENDENT_AMBULATORY_CARE_PROVIDER_SITE_OTHER): Payer: Managed Care, Other (non HMO) | Admitting: Women's Health

## 2016-08-06 ENCOUNTER — Other Ambulatory Visit: Payer: Managed Care, Other (non HMO)

## 2016-08-06 VITALS — BP 124/68 | HR 76 | Wt 260.0 lb

## 2016-08-06 DIAGNOSIS — Z363 Encounter for antenatal screening for malformations: Secondary | ICD-10-CM

## 2016-08-06 DIAGNOSIS — Z1389 Encounter for screening for other disorder: Secondary | ICD-10-CM

## 2016-08-06 DIAGNOSIS — Z331 Pregnant state, incidental: Secondary | ICD-10-CM

## 2016-08-06 DIAGNOSIS — Z3402 Encounter for supervision of normal first pregnancy, second trimester: Secondary | ICD-10-CM

## 2016-08-06 LAB — POCT URINALYSIS DIPSTICK
GLUCOSE UA: NEGATIVE
KETONES UA: NEGATIVE
NITRITE UA: NEGATIVE

## 2016-08-06 NOTE — Progress Notes (Signed)
Low-risk OB appointment G1P0000 625w6d Estimated Date of Delivery: 11/20/16 BP 124/68   Pulse 76   Wt 260 lb (117.9 kg)   LMP 02/05/2016   BMI 45.33 kg/m   BP, weight, and urine reviewed.  Refer to obstetrical flow sheet for FH & FHR.  Reports good fm.  Denies regular uc's, lof, vb, or uti s/s. No complaints. [facility], none since.  Reviewed ptl s/s, fm. Plan:  Continue routine obstetrical care  F/U in 3wks for OB appointment , f/u anatomy u/s, and pn2 Getting flu shot at work

## 2016-08-06 NOTE — Patient Instructions (Signed)
You will have your sugar test next visit.  Please do not eat or drink anything after midnight the night before you come, not even water.  You will be here for at least two hours.     Call the office ([phone]) or go to [facility] if:  You begin to have strong, frequent contractions  Your water breaks.  Sometimes it is a big gush of fluid, sometimes it is just a trickle that keeps getting your panties wet or running down your legs  You have vaginal bleeding.  It is normal to have a small amount of spotting if your cervix was checked.   You don't feel your baby moving like normal.  If you don't, get you something to eat and drink and lay down and focus on feeling your baby move.   If your baby is still not moving like normal, you should call the office or go to [facility].  Second Trimester of Pregnancy The second trimester is from week 13 through week 28, months 4 through 6. The second trimester is often a time when you feel your best. Your body has also adjusted to being pregnant, and you begin to feel better physically. Usually, morning sickness has lessened or quit completely, you may have more energy, and you may have an increase in appetite. The second trimester is also a time when the fetus is growing rapidly. At the end of the sixth month, the fetus is about 9 inches long and weighs about 1 pounds. You will likely begin to feel the baby move (quickening) between 18 and 20 weeks of the pregnancy. BODY CHANGES Your body goes through many changes during pregnancy. The changes vary from woman to woman.   Your weight will continue to increase. You will notice your lower abdomen bulging out.  You may begin to get stretch marks on your hips, abdomen, and breasts.  You may develop headaches that can be relieved by medicines approved by your health care provider.  You may urinate more often because the fetus is pressing on your bladder.  You may develop or continue to have  heartburn as a result of your pregnancy.  You may develop constipation because certain hormones are causing the muscles that push waste through your intestines to slow down.  You may develop hemorrhoids or swollen, bulging veins (varicose veins).  You may have back pain because of the weight gain and pregnancy hormones relaxing your joints between the bones in your pelvis and as a result of a shift in weight and the muscles that support your balance.  Your breasts will continue to grow and be tender.  Your gums may bleed and may be sensitive to brushing and flossing.  Dark spots or blotches (chloasma, mask of pregnancy) may develop on your face. This will likely fade after the baby is born.  A dark line from your belly button to the pubic area (linea nigra) may appear. This will likely fade after the baby is born.  You may have changes in your hair. These can include thickening of your hair, rapid growth, and changes in texture. Some women also have hair loss during or after pregnancy, or hair that feels dry or thin. Your hair will most likely return to normal after your baby is born. WHAT TO EXPECT AT YOUR PRENATAL VISITS During a routine prenatal visit:  You will be weighed to make sure you and the fetus are growing normally.  Your blood pressure will be taken.    Your abdomen will be measured to track your baby's growth.  The fetal heartbeat will be listened to.  Any test results from the previous visit will be discussed. Your health care provider may ask you:  How you are feeling.  If you are feeling the baby move.  If you have had any abnormal symptoms, such as leaking fluid, bleeding, severe headaches, or abdominal cramping.  If you have any questions. Other tests that may be performed during your second trimester include:  Blood tests that check for:  Low iron levels (anemia).  Gestational diabetes (between 24 and 28 weeks).  Rh antibodies.  Urine tests to check  for infections, diabetes, or protein in the urine.  An ultrasound to confirm the proper growth and development of the baby.  An amniocentesis to check for possible genetic problems.  Fetal screens for spina bifida and Down syndrome. HOME CARE INSTRUCTIONS   Avoid all smoking, herbs, alcohol, and unprescribed drugs. These chemicals affect the formation and growth of the baby.  Follow your health care provider's instructions regarding medicine use. There are medicines that are either safe or unsafe to take during pregnancy.  Exercise only as directed by your health care provider. Experiencing uterine cramps is a good sign to stop exercising.  Continue to eat regular, healthy meals.  Wear a good support bra for breast tenderness.  Do not use hot tubs, steam rooms, or saunas.  Wear your seat belt at all times when driving.  Avoid raw meat, uncooked cheese, cat litter boxes, and soil used by cats. These carry germs that can cause birth defects in the baby.  Take your prenatal vitamins.  Try taking a stool softener (if your health care provider approves) if you develop constipation. Eat more high-fiber foods, such as fresh vegetables or fruit and whole grains. Drink plenty of fluids to keep your urine clear or pale yellow.  Take warm sitz baths to soothe any pain or discomfort caused by hemorrhoids. Use hemorrhoid cream if your health care provider approves.  If you develop varicose veins, wear support hose. Elevate your feet for 15 minutes, 3-4 times a day. Limit salt in your diet.  Avoid heavy lifting, wear low heel shoes, and practice good posture.  Rest with your legs elevated if you have leg cramps or low back pain.  Visit your dentist if you have not gone yet during your pregnancy. Use a soft toothbrush to brush your teeth and be gentle when you floss.  A sexual relationship may be continued unless your health care provider directs you otherwise.  Continue to go to all your  prenatal visits as directed by your health care provider. SEEK MEDICAL CARE IF:   You have dizziness.  You have mild pelvic cramps, pelvic pressure, or nagging pain in the abdominal area.  You have persistent nausea, vomiting, or diarrhea.  You have a bad smelling vaginal discharge.  You have pain with urination. SEEK IMMEDIATE MEDICAL CARE IF:   You have a fever.  You are leaking fluid from your vagina.  You have spotting or bleeding from your vagina.  You have severe abdominal cramping or pain.  You have rapid weight gain or loss.  You have shortness of breath with chest pain.  You notice sudden or extreme swelling of your face, hands, ankles, feet, or legs.  You have not felt your baby move in over an hour.  You have severe headaches that do not go away with medicine.  You have vision changes.   Document Released: 10/22/2001 Document Revised: 11/02/2013 Document Reviewed: 12/29/2012 ExitCare Patient Information 2015 [employer]. This information is not intended to replace advice given to you by your health care provider. Make sure you discuss any questions you have with your health care provider.     

## 2016-08-06 NOTE — Addendum Note (Signed)
Addended by: Shawna ClampBOOKER, Jezabelle Chisolm R on: 08/06/2016 11:12 AM   Modules accepted: Orders

## 2016-08-28 ENCOUNTER — Other Ambulatory Visit: Payer: Managed Care, Other (non HMO)

## 2016-08-28 ENCOUNTER — Encounter: Payer: Self-pay | Admitting: Women's Health

## 2016-08-28 ENCOUNTER — Ambulatory Visit (INDEPENDENT_AMBULATORY_CARE_PROVIDER_SITE_OTHER): Payer: Managed Care, Other (non HMO)

## 2016-08-28 ENCOUNTER — Ambulatory Visit (INDEPENDENT_AMBULATORY_CARE_PROVIDER_SITE_OTHER): Payer: Managed Care, Other (non HMO) | Admitting: Women's Health

## 2016-08-28 VITALS — BP 134/84 | HR 72 | Wt 264.0 lb

## 2016-08-28 DIAGNOSIS — O321XX1 Maternal care for breech presentation, fetus 1: Secondary | ICD-10-CM | POA: Diagnosis not present

## 2016-08-28 DIAGNOSIS — Z3A28 28 weeks gestation of pregnancy: Secondary | ICD-10-CM

## 2016-08-28 DIAGNOSIS — Z3483 Encounter for supervision of other normal pregnancy, third trimester: Secondary | ICD-10-CM

## 2016-08-28 DIAGNOSIS — Z3403 Encounter for supervision of normal first pregnancy, third trimester: Secondary | ICD-10-CM

## 2016-08-28 DIAGNOSIS — O99213 Obesity complicating pregnancy, third trimester: Secondary | ICD-10-CM

## 2016-08-28 DIAGNOSIS — Z1389 Encounter for screening for other disorder: Secondary | ICD-10-CM

## 2016-08-28 DIAGNOSIS — Z363 Encounter for antenatal screening for malformations: Secondary | ICD-10-CM | POA: Diagnosis not present

## 2016-08-28 DIAGNOSIS — Z331 Pregnant state, incidental: Secondary | ICD-10-CM

## 2016-08-28 DIAGNOSIS — Z131 Encounter for screening for diabetes mellitus: Secondary | ICD-10-CM

## 2016-08-28 LAB — POCT URINALYSIS DIPSTICK
Blood, UA: NEGATIVE
GLUCOSE UA: NEGATIVE
Ketones, UA: NEGATIVE
Nitrite, UA: NEGATIVE
PROTEIN UA: NEGATIVE

## 2016-08-28 NOTE — Patient Instructions (Signed)

## 2016-08-28 NOTE — Progress Notes (Signed)
Low-risk OB appointment G1P0000 3533w0d Estimated Date of Delivery: 11/20/16 BP 134/84   Pulse 72   Wt 264 lb (119.7 kg)   LMP 02/05/2016   BMI 46.03 kg/m   BP, weight, and urine reviewed.  Refer to obstetrical flow sheet for FH & FHR.  Reports good fm.  Denies regular uc's, lof, vb, or uti s/s. No complaints. Getting flu shot tomorrow at work.  Reviewed today's f/u u/s for limited views- still limited views of OFTs and diaphragm. Discussed ptl s/s, fkc. Recommended Tdap at HD/PCP per CDC guidelines.  Plan:  Continue routine obstetrical care  F/U in 4wks for OB appointment, f/u u/s d/t limited views OFTs/diaphragm PN2 today

## 2016-08-28 NOTE — Progress Notes (Signed)
US 28 wks,breech,cx 3.1 cm, fhr 159 bpm,ant pl gr 1,afi 13.5 cm,efw 1329 g 68 %,limited view of outflow tract of the heart,all other anatomy complete,no obvious abnormalities seen,limited ultrasound because of pt body habitus.

## 2016-08-29 LAB — GLUCOSE TOLERANCE, 2 HOURS W/ 1HR
Glucose, 1 hour: 131 mg/dL (ref 65–179)
Glucose, 2 hour: 112 mg/dL (ref 65–152)
Glucose, Fasting: 72 mg/dL (ref 65–91)

## 2016-08-29 LAB — CBC
Hematocrit: 35.1 % (ref 34.0–46.6)
Hemoglobin: 11.2 g/dL (ref 11.1–15.9)
MCH: 28.9 pg (ref 26.6–33.0)
MCHC: 31.9 g/dL (ref 31.5–35.7)
MCV: 91 fL (ref 79–97)
PLATELETS: 379 10*3/uL (ref 150–379)
RBC: 3.87 x10E6/uL (ref 3.77–5.28)
RDW: 12.9 % (ref 12.3–15.4)
WBC: 11.2 10*3/uL — AB (ref 3.4–10.8)

## 2016-08-29 LAB — HIV ANTIBODY (ROUTINE TESTING W REFLEX): HIV SCREEN 4TH GENERATION: NONREACTIVE

## 2016-08-29 LAB — ANTIBODY SCREEN: Antibody Screen: NEGATIVE

## 2016-08-29 LAB — RPR: RPR: NONREACTIVE

## 2016-09-05 ENCOUNTER — Encounter: Payer: Self-pay | Admitting: Obstetrics and Gynecology

## 2016-09-05 ENCOUNTER — Ambulatory Visit (INDEPENDENT_AMBULATORY_CARE_PROVIDER_SITE_OTHER): Payer: Managed Care, Other (non HMO) | Admitting: Obstetrics and Gynecology

## 2016-09-05 VITALS — BP 112/70 | HR 76 | Wt 268.0 lb

## 2016-09-05 DIAGNOSIS — N390 Urinary tract infection, site not specified: Secondary | ICD-10-CM | POA: Diagnosis not present

## 2016-09-05 DIAGNOSIS — Z3403 Encounter for supervision of normal first pregnancy, third trimester: Secondary | ICD-10-CM

## 2016-09-05 DIAGNOSIS — Z1389 Encounter for screening for other disorder: Secondary | ICD-10-CM

## 2016-09-05 DIAGNOSIS — Z3A29 29 weeks gestation of pregnancy: Secondary | ICD-10-CM

## 2016-09-05 DIAGNOSIS — Z331 Pregnant state, incidental: Secondary | ICD-10-CM

## 2016-09-05 DIAGNOSIS — O2343 Unspecified infection of urinary tract in pregnancy, third trimester: Secondary | ICD-10-CM

## 2016-09-05 LAB — POCT URINALYSIS DIPSTICK
GLUCOSE UA: NEGATIVE
KETONES UA: NEGATIVE
Nitrite, UA: NEGATIVE
Protein, UA: NEGATIVE

## 2016-09-05 MED ORDER — NITROFURANTOIN MONOHYD MACRO 100 MG PO CAPS: 100.0000 mg | ORAL_CAPSULE | Freq: Two times a day (BID) | ORAL | 0 refills | Status: DC

## 2016-09-05 NOTE — Progress Notes (Signed)
G1P0000  Estimated Date of Delivery: 11/20/16 LROB 4567w1d  Blood pressure 112/70, pulse 76, weight 268 lb (121.6 kg), last menstrual period 02/05/2016.   Urine results:notable for moderate leukocytes and trace blood  Chief Complaint  Patient presents with  . w/i    thinks she may have an UTI-urgency    Patient complaints: concerned she has a UTI, presents with urgency. Patient reports   good fetal movement,                           denies any bleeding , rupture of membranes,or regular contractions.   refer to the ob flow sheet for FH and FHR, ,                          Physical Examination: General appearance - alert, well appearing, and in no distress                                      Abdomen - FH  ,                                                         -FHR 17                                                         soft, nontender, nondistended, no masses or organomegaly                                      elvic - examination not indicated                                           Urinalysis positive for leukocyleukocytes an Questions were answered. Assessment: LROB G1P0000 @ 3167w1d   Plan:  Continued routine obstetrical care, start Macrodantin   F/u as scheduled    By signing my name below, I, Sonum Patel, attest that this documentation has been prepared under the direction and in the presence of Tilda BurrowJohn V Yasmene Salomone, MD. Electronically Signed: Sonum Patel, [facility]. 09/05/16. 4:18 PM.  I personally performed the services described in this documentation, which was SCRIBED in my presence. The recorded information has been reviewed and considered accurate. It has been edited as necessary during review. [name],Lochlan Grygiel V, MD

## 2016-09-09 ENCOUNTER — Telehealth: Payer: Self-pay | Admitting: Women's Health

## 2016-09-09 NOTE — Telephone Encounter (Signed)
Pt states she has cold, congestion, sore throat and cough.  Pt reports good FM, no cramping, bleeding or gush of fluids.  Advised pt to take Tylenol as needed, push fluids, rest and can use Sudafed or Mucinex.  Also informed her she can use a saline nasal spray and sleep with humidifier.  Pt verbalized understanding.

## 2016-09-17 DIAGNOSIS — Z029 Encounter for administrative examinations, unspecified: Secondary | ICD-10-CM

## 2016-09-19 ENCOUNTER — Other Ambulatory Visit: Payer: Self-pay | Admitting: Women's Health

## 2016-09-19 DIAGNOSIS — Z0489 Encounter for examination and observation for other specified reasons: Secondary | ICD-10-CM

## 2016-09-19 DIAGNOSIS — IMO0002 Reserved for concepts with insufficient information to code with codable children: Secondary | ICD-10-CM

## 2016-09-22 ENCOUNTER — Inpatient Hospital Stay (HOSPITAL_COMMUNITY): Payer: Managed Care, Other (non HMO)

## 2016-09-22 ENCOUNTER — Encounter (HOSPITAL_COMMUNITY): Payer: Self-pay | Admitting: *Deleted

## 2016-09-22 ENCOUNTER — Inpatient Hospital Stay (HOSPITAL_COMMUNITY)
Admission: AD | Admit: 2016-09-22 | Discharge: 2016-10-09 | DRG: 765 | Disposition: A | Discharge status: Home / Self Care | Payer: Managed Care, Other (non HMO) | Source: Ambulatory Visit | Attending: Obstetrics & Gynecology | Admitting: Obstetrics & Gynecology

## 2016-09-22 DIAGNOSIS — O99214 Obesity complicating childbirth: Secondary | ICD-10-CM | POA: Diagnosis present

## 2016-09-22 DIAGNOSIS — Z87891 Personal history of nicotine dependence: Secondary | ICD-10-CM

## 2016-09-22 DIAGNOSIS — O139 Gestational [pregnancy-induced] hypertension without significant proteinuria, unspecified trimester: Secondary | ICD-10-CM

## 2016-09-22 DIAGNOSIS — Z3A31 31 weeks gestation of pregnancy: Secondary | ICD-10-CM

## 2016-09-22 DIAGNOSIS — O134 Gestational [pregnancy-induced] hypertension without significant proteinuria, complicating childbirth: Secondary | ICD-10-CM

## 2016-09-22 DIAGNOSIS — O42013 Preterm premature rupture of membranes, onset of labor within 24 hours of rupture, third trimester: Principal | ICD-10-CM | POA: Diagnosis present

## 2016-09-22 DIAGNOSIS — Z6841 Body Mass Index (BMI) 40.0 and over, adult: Secondary | ICD-10-CM | POA: Diagnosis not present

## 2016-09-22 DIAGNOSIS — B009 Herpesviral infection, unspecified: Secondary | ICD-10-CM | POA: Diagnosis not present

## 2016-09-22 DIAGNOSIS — O133 Gestational [pregnancy-induced] hypertension without significant proteinuria, third trimester: Secondary | ICD-10-CM | POA: Diagnosis not present

## 2016-09-22 DIAGNOSIS — O9832 Other infections with a predominantly sexual mode of transmission complicating childbirth: Secondary | ICD-10-CM | POA: Diagnosis not present

## 2016-09-22 DIAGNOSIS — O42919 Preterm premature rupture of membranes, unspecified as to length of time between rupture and onset of labor, unspecified trimester: Secondary | ICD-10-CM | POA: Diagnosis present

## 2016-09-22 DIAGNOSIS — A6000 Herpesviral infection of urogenital system, unspecified: Secondary | ICD-10-CM | POA: Diagnosis present

## 2016-09-22 DIAGNOSIS — O321XX0 Maternal care for breech presentation, not applicable or unspecified: Secondary | ICD-10-CM | POA: Diagnosis present

## 2016-09-22 DIAGNOSIS — O42913 Preterm premature rupture of membranes, unspecified as to length of time between rupture and onset of labor, third trimester: Secondary | ICD-10-CM | POA: Diagnosis not present

## 2016-09-22 DIAGNOSIS — Z3A32 32 weeks gestation of pregnancy: Secondary | ICD-10-CM | POA: Diagnosis not present

## 2016-09-22 DIAGNOSIS — Z3403 Encounter for supervision of normal first pregnancy, third trimester: Secondary | ICD-10-CM

## 2016-09-22 LAB — CBC
HCT: 32.5 % — ABNORMAL LOW (ref 36.0–46.0)
Hemoglobin: 10.6 g/dL — ABNORMAL LOW (ref 12.0–15.0)
MCH: 28.6 pg (ref 26.0–34.0)
MCHC: 32.6 g/dL (ref 30.0–36.0)
MCV: 87.6 fL (ref 78.0–100.0)
PLATELETS: 390 10*3/uL (ref 150–400)
RBC: 3.71 MIL/uL — ABNORMAL LOW (ref 3.87–5.11)
RDW: 13.1 % (ref 11.5–15.5)
WBC: 12.1 10*3/uL — AB (ref 4.0–10.5)

## 2016-09-22 LAB — URINALYSIS, ROUTINE W REFLEX MICROSCOPIC
Bilirubin Urine: NEGATIVE
GLUCOSE, UA: NEGATIVE mg/dL
Hgb urine dipstick: NEGATIVE
KETONES UR: NEGATIVE mg/dL
Nitrite: NEGATIVE
PH: 6 (ref 5.0–8.0)
Protein, ur: NEGATIVE mg/dL
Specific Gravity, Urine: <1.005 — ABNORMAL LOW (ref 1.005–1.030)

## 2016-09-22 LAB — WET PREP, GENITAL
Clue Cells Wet Prep HPF POC: NONE SEEN
SPERM: NONE SEEN
TRICH WET PREP: NONE SEEN
YEAST WET PREP: NONE SEEN

## 2016-09-22 LAB — COMPREHENSIVE METABOLIC PANEL
ALT: 11 U/L — AB (ref 14–54)
AST: 18 U/L (ref 15–41)
Albumin: 2.9 g/dL — ABNORMAL LOW (ref 3.5–5.0)
Alkaline Phosphatase: 104 U/L (ref 38–126)
Anion gap: 8 (ref 5–15)
BUN: 5 mg/dL — AB (ref 6–20)
CHLORIDE: 105 mmol/L (ref 101–111)
CO2: 23 mmol/L (ref 22–32)
CREATININE: 0.59 mg/dL (ref 0.44–1.00)
Calcium: 9.2 mg/dL (ref 8.9–10.3)
GFR calc Af Amer: >60 mL/min (ref >60)
Glucose, Bld: 93 mg/dL (ref 65–99)
Potassium: 3.6 mmol/L (ref 3.5–5.1)
Sodium: 136 mmol/L (ref 135–145)
Total Bilirubin: 0.1 mg/dL — ABNORMAL LOW (ref 0.3–1.2)
Total Protein: 6.7 g/dL (ref 6.5–8.1)

## 2016-09-22 LAB — GROUP B STREP BY PCR: Group B strep by PCR: NEGATIVE

## 2016-09-22 LAB — ABO/RH: ABO/RH(D): AB POS

## 2016-09-22 LAB — TYPE AND SCREEN
ABO/RH(D): AB POS
Antibody Screen: NEGATIVE

## 2016-09-22 LAB — URINE MICROSCOPIC-ADD ON

## 2016-09-22 LAB — AMNISURE RUPTURE OF MEMBRANE (ROM) NOT AT ARMC: AMNISURE: POSITIVE

## 2016-09-22 MED ORDER — SODIUM CHLORIDE 0.9 % IV SOLN
2.0000 g | Freq: Four times a day (QID) | INTRAVENOUS | Status: AC
  Administered 2016-09-22 – 2016-09-24 (×8): 2 g via INTRAVENOUS
  Filled 2016-09-22 (×8): qty 2000

## 2016-09-22 MED ORDER — ACETAMINOPHEN 325 MG PO TABS
650.0000 mg | ORAL_TABLET | ORAL | Status: DC | PRN
  Administered 2016-09-22 – 2016-10-06 (×6): 650 mg via ORAL
  Filled 2016-09-22 (×6): qty 2

## 2016-09-22 MED ORDER — PRENATAL MULTIVITAMIN CH
1.0000 | ORAL_TABLET | Freq: Every day | ORAL | Status: DC
  Administered 2016-09-23 – 2016-10-05 (×13): 1 via ORAL
  Filled 2016-09-22 (×13): qty 1

## 2016-09-22 MED ORDER — ZOLPIDEM TARTRATE 5 MG PO TABS: 5.0000 mg | ORAL_TABLET | Freq: Every evening | ORAL | Status: DC | PRN

## 2016-09-22 MED ORDER — DOCUSATE SODIUM 100 MG PO CAPS
100.0000 mg | ORAL_CAPSULE | Freq: Every day | ORAL | Status: DC
  Administered 2016-09-23 – 2016-10-06 (×14): 100 mg via ORAL
  Filled 2016-09-22 (×14): qty 1

## 2016-09-22 MED ORDER — BETAMETHASONE SOD PHOS & ACET 6 (3-3) MG/ML IJ SUSP
12.0000 mg | Freq: Once | INTRAMUSCULAR | Status: AC
  Administered 2016-09-23: 12 mg via INTRAMUSCULAR
  Filled 2016-09-22: qty 2

## 2016-09-22 MED ORDER — CALCIUM CARBONATE ANTACID 500 MG PO CHEW
2.0000 | CHEWABLE_TABLET | ORAL | Status: DC | PRN
  Administered 2016-09-24: 400 mg via ORAL
  Filled 2016-09-22: qty 2

## 2016-09-22 MED ORDER — LACTATED RINGERS IV SOLN
INTRAVENOUS | Status: DC
Start: 2016-09-22 — End: 2016-09-23
  Administered 2016-09-22 (×2): via INTRAVENOUS

## 2016-09-22 MED ORDER — AZITHROMYCIN 250 MG PO TABS
1000.0000 mg | ORAL_TABLET | Freq: Once | ORAL | Status: AC
  Administered 2016-09-22: 1000 mg via ORAL
  Filled 2016-09-22: qty 4

## 2016-09-22 MED ORDER — BETAMETHASONE SOD PHOS & ACET 6 (3-3) MG/ML IJ SUSP
12.0000 mg | Freq: Once | INTRAMUSCULAR | Status: AC
  Administered 2016-09-22: 12 mg via INTRAMUSCULAR
  Filled 2016-09-22: qty 2

## 2016-09-22 NOTE — MAU Note (Signed)
Pt states that around 1500 she started leaking today.  She states she thought it was peed but she called and was told to come in and be evaluated.  Pt denies bleeding and states she is feeling the baby move.

## 2016-09-22 NOTE — H&P (Signed)
Brittany [name] is a 25 y.o. female G1 @ 31.4 wks presenting for SROM at 1500. States had large gush of fluid and has continued to leak sm amts at intervals. Denies vag bleeding. States good fetal movement.. OB History    Gravida Para Term Preterm AB Living   1 0 0 0 0 0   SAB TAB Ectopic Multiple Live Births   0 0 0 0       Past Medical History:  Diagnosis Date  . Contraceptive management 03/04/2013  . HSV-1 infection   . Medical history non-contributory   . Obesity   . Pregnant 03/26/2016   Past Surgical History:  Procedure Laterality Date  . adnoids  1995  . WISDOM TOOTH EXTRACTION     Family History: family history includes Cancer in her cousin, maternal grandmother, maternal uncle, paternal grandfather, and paternal uncle. Social History:  reports that she quit smoking about 3 years ago. Her smoking use included Cigarettes. She has never used smokeless tobacco. She reports that she does not drink alcohol or use drugs.     Maternal Diabetes: No Genetic Screening: Normal Maternal Ultrasounds/Referrals: Normal Fetal Ultrasounds or other Referrals:  None Maternal Substance Abuse:  No Significant Maternal Medications:  None Significant Maternal Lab Results:  None Other Comments:  None  Review of Systems  Constitutional: Negative.   HENT: Negative.   Eyes: Negative.   Respiratory: Negative.   Cardiovascular: Negative.   Gastrointestinal: Negative.   Genitourinary: Negative.   Musculoskeletal: Positive for back pain.  Skin: Negative.   Neurological: Negative.   Endo/Heme/Allergies: Negative.   Psychiatric/Behavioral: Negative.    Maternal Medical History:  Reason for admission: Rupture of membranes.   Contractions: Frequency: rare.   Perceived severity is mild.    Fetal activity: Perceived fetal activity is normal.   Last perceived fetal movement was within the past hour.    Prenatal complications: no prenatal complications Prenatal Complications - Diabetes:  none.    Dilation: Closed ((Visually)) Exam by:: D. Hart RochesterLawson, CNM Blood pressure 138/81, pulse 90, temperature 98.3 F (36.8 C), temperature source Oral, resp. rate 18, last menstrual period 02/05/2016, SpO2 100 %. Maternal Exam:  Uterine Assessment: Contraction strength is mild.  Isolated contractions  Abdomen: Patient reports no abdominal tenderness. Introitus: Normal vulva. Normal vagina.  Ferning test: positive.  Amniotic fluid character: clear.  Pelvis: adequate for delivery.   Cervix: Cervix evaluated by sterile speculum exam.     Fetal Exam Fetal Monitor Review: Mode: ultrasound.   Variability: moderate (6-25 bpm).   Pattern: accelerations present.    Fetal State Assessment: Category I - tracings are normal.     Physical Exam  Constitutional: She is oriented to person, place, and time. She appears well-developed and well-nourished.  HENT:  Head: Normocephalic.  Eyes: Pupils are equal, round, and reactive to light.  Neck: Normal range of motion.  Cardiovascular: Normal rate, regular rhythm, normal heart sounds and intact distal pulses.   Respiratory: Effort normal and breath sounds normal.  GI: Soft. Bowel sounds are normal.  Genitourinary: Vagina normal and uterus normal.  Musculoskeletal: Normal range of motion.  Neurological: She is alert and oriented to person, place, and time. She has normal reflexes.  Skin: Skin is warm and dry.  Psychiatric: She has a normal mood and affect. Her behavior is normal. Judgment and thought content normal.    Prenatal labs: ABO, Rh: AB/Positive/-- (06/05 1631) Antibody: Negative (10/18 0919) Rubella: 2.08 (06/05 1631) RPR: Non Reactive (10/18 0919)  HBsAg:  Negative (06/05 1631)  HIV: Non Reactive (10/18 0919)  GBS:     Assessment/Plan: PROM @ 31.4 wks. Sterils spec exam done leaking clear fluid. amnisure pos, cervix visually closed. P: admit, BMX Iv antibiotics Cultures Admit labs [place] for AFI and presentation Consulted  Dr. Erin FullingHarraway Smith regarding POC   Wyvonnia DuskyMarie Lacharles Altschuler 09/22/2016, 4:56 PM

## 2016-09-22 NOTE — Progress Notes (Signed)
Brittany [name]. Lawson, CNM notified of pt arrival to MAU.  Notified that pt is a G1P0 at 7912w4d.  Notified that pt was leaking and thought she had peed on herself but called the nurse line and they told her to come in.  Provider states to collect an amnisure.

## 2016-09-23 ENCOUNTER — Encounter: Payer: Managed Care, Other (non HMO) | Admitting: Obstetrics and Gynecology

## 2016-09-23 ENCOUNTER — Telehealth: Payer: Self-pay | Admitting: *Deleted

## 2016-09-23 ENCOUNTER — Other Ambulatory Visit: Payer: Managed Care, Other (non HMO)

## 2016-09-23 DIAGNOSIS — O42913 Preterm premature rupture of membranes, unspecified as to length of time between rupture and onset of labor, third trimester: Secondary | ICD-10-CM

## 2016-09-23 LAB — GC/CHLAMYDIA PROBE AMP (~~LOC~~) NOT AT ARMC
Chlamydia: NEGATIVE
NEISSERIA GONORRHEA: NEGATIVE

## 2016-09-23 LAB — RPR: RPR Ser Ql: NONREACTIVE

## 2016-09-23 NOTE — Progress Notes (Signed)
[name], female   DOB: 12-19-1990, 25 y.o.   MRN: [id number] [name] ANTEPARTUM COMPREHENSIVE PROGRESS NOTE  Brittany [name] is a 25 y.o. G1P0000 at 6651w5d  who is admitted for PROM.   Fetal presentation is breech. Length of Stay:  1  Days  Subjective: Pt reports continued lLOF. Occ small amount of blood Patient reports good fetal movement.  She reports no uterine contractions.,  Vitals:  Blood pressure 134/74, pulse 67, temperature 98.5 F (36.9 C), temperature source Oral, resp. rate 18, height 5\' 3"  (1.6 m), weight 268 lb (121.6 kg), last menstrual period 02/05/2016, SpO2 98 %. Physical Examination: General appearance - alert, well appearing, and in no distress Abdomen - soft, nontender, nondistended, no masses or organomegaly gravid Extremities - peripheral pulses normal, no pedal edema, no clubbing or cyanosis Cervical Exam: Not evaluated.  Membranes:ruptured  Fetal Monitoring:  Baseline: 130's bpm, Variability: Good {> 6 bpm), Accelerations: Reactive and Decelerations: Absent  Labs:  Results for orders placed or performed during the hospital encounter of 09/22/16 (from the past 24 hour(s))  Urinalysis, Routine w reflex microscopic ([facility])   Collection Time: 09/22/16  3:45 PM  Result Value Ref Range   Color, Urine YELLOW YELLOW   APPearance CLEAR CLEAR   Specific Gravity, Urine <1.005 (L) 1.005 - 1.030   pH 6.0 5.0 - 8.0   Glucose, UA NEGATIVE NEGATIVE mg/dL   Hgb urine dipstick NEGATIVE NEGATIVE   Bilirubin Urine NEGATIVE NEGATIVE   Ketones, ur NEGATIVE NEGATIVE mg/dL   Protein, ur NEGATIVE NEGATIVE mg/dL   Nitrite NEGATIVE NEGATIVE   Leukocytes, UA TRACE (A) NEGATIVE  Urine microscopic-add on   Collection Time: 09/22/16  3:45 PM  Result Value Ref Range   Squamous Epithelial / LPF 6-30 (A) NONE SEEN   WBC, UA 0-5 0 - 5 WBC/hpf   RBC / HPF 0-5 0 - 5 RBC/hpf   Bacteria, UA FEW (A) NONE SEEN  Amnisure rupture of membrane (rom)not at [facility]    Collection Time: 09/22/16  4:15 PM  Result Value Ref Range   Amnisure ROM POSITIVE   Wet prep, genital   Collection Time: 09/22/16  4:40 PM  Result Value Ref Range   Yeast Wet Prep HPF POC NONE SEEN NONE SEEN   Trich, Wet Prep NONE SEEN NONE SEEN   Clue Cells Wet Prep HPF POC NONE SEEN NONE SEEN   WBC, Wet Prep HPF POC FEW (A) NONE SEEN   Sperm NONE SEEN   Group B strep by PCR   Collection Time: 09/22/16  4:40 PM  Result Value Ref Range   Group B strep by PCR NEGATIVE NEGATIVE  Comprehensive metabolic panel   Collection Time: 09/22/16  4:55 PM  Result Value Ref Range   Sodium 136 135 - 145 mmol/L   Potassium 3.6 3.5 - 5.1 mmol/L   Chloride 105 101 - 111 mmol/L   CO2 23 22 - 32 mmol/L   Glucose, Bld 93 65 - 99 mg/dL   BUN 5 (L) 6 - 20 mg/dL   Creatinine, Ser 1.910.59 0.44 - 1.00 mg/dL   Calcium 9.2 8.9 - 47.810.3 mg/dL   Total Protein 6.7 6.5 - 8.1 g/dL   Albumin 2.9 (L) 3.5 - 5.0 g/dL   AST 18 15 - 41 U/L   ALT 11 (L) 14 - 54 U/L   Alkaline Phosphatase 104 38 - 126 U/L   Total Bilirubin 0.1 (L) 0.3 - 1.2 mg/dL   GFR  calc non Af Amer >60 >60 mL/min   GFR calc Af Amer >60 >60 mL/min   Anion gap 8 5 - 15  CBC on admission   Collection Time: 09/22/16  4:55 PM  Result Value Ref Range   WBC 12.1 (H) 4.0 - 10.5 K/uL   RBC 3.71 (L) 3.87 - 5.11 MIL/uL   Hemoglobin 10.6 (L) 12.0 - 15.0 g/dL   HCT 16.132.5 (L) 09.636.0 - 04.546.0 %   MCV 87.6 78.0 - 100.0 fL   MCH 28.6 26.0 - 34.0 pg   MCHC 32.6 30.0 - 36.0 g/dL   RDW 40.913.1 81.111.5 - 91.415.5 %   Platelets 390 150 - 400 K/uL  Type and screen [facility]   Collection Time: 09/22/16  4:55 PM  Result Value Ref Range   ABO/RH(D) AB POS    Antibody Screen NEG    Sample Expiration 09/25/2016   ABO/Rh   Collection Time: 09/22/16  4:55 PM  Result Value Ref Range   ABO/RH(D) AB POS     Imaging Studies:    09/22/16 full report pending  Medications:  Scheduled . ampicillin (OMNIPEN) IV  2 g Intravenous Q6H  . betamethasone  acetate-betamethasone sodium phosphate  12 mg Intramuscular Once  . docusate sodium  100 mg Oral Daily  . prenatal multivitamin  1 tablet Oral Q1200   I have reviewed the patient's current medications.  ASSESSMENT: Patient Active Problem List   Diagnosis Date Noted  . Preterm premature rupture of membranes (PPROM) with unknown onset of labor 09/22/2016  . UTI (urinary tract infection) during pregnancy 06/04/2016  . Supervision of normal first pregnancy 04/15/2016  . 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 Obesity     PLAN: Cont atbx for latency NICU consult F/u on US report Complete BMZ course Magnesium sulfate if delivery imminent until 32 weeks Delivery at 34 weeks or sooner for adverse fetal or maternal concerns Continue routine antenatal care.   Brittany [name] 09/23/2016,6:32 AM

## 2016-09-23 NOTE — Telephone Encounter (Signed)
[facility], unsure how long she will be there, needs a work note faxed that is has been hospitalized. Pt informed [facility] and request work note due to unsure how long they plan to keep her. Cyril MourningJennifer Griffin, NP agreed with recommendation.

## 2016-09-24 ENCOUNTER — Inpatient Hospital Stay (HOSPITAL_COMMUNITY): Payer: Managed Care, Other (non HMO)

## 2016-09-24 ENCOUNTER — Encounter (HOSPITAL_COMMUNITY): Payer: Self-pay | Admitting: Anesthesiology

## 2016-09-24 DIAGNOSIS — O42919 Preterm premature rupture of membranes, unspecified as to length of time between rupture and onset of labor, unspecified trimester: Secondary | ICD-10-CM

## 2016-09-24 DIAGNOSIS — B009 Herpesviral infection, unspecified: Secondary | ICD-10-CM

## 2016-09-24 DIAGNOSIS — Z3A31 31 weeks gestation of pregnancy: Secondary | ICD-10-CM

## 2016-09-24 MED ORDER — AMOXICILLIN 500 MG PO CAPS: 500.0000 mg | ORAL_CAPSULE | Freq: Three times a day (TID) | ORAL | Status: DC

## 2016-09-24 MED ORDER — PANTOPRAZOLE SODIUM 40 MG PO TBEC
40.0000 mg | DELAYED_RELEASE_TABLET | Freq: Every day | ORAL | Status: DC
  Administered 2016-09-24 – 2016-10-06 (×13): 40 mg via ORAL
  Filled 2016-09-24 (×13): qty 1

## 2016-09-24 MED ORDER — SODIUM CHLORIDE 0.9% FLUSH
3.0000 mL | Freq: Two times a day (BID) | INTRAVENOUS | Status: DC
  Administered 2016-09-24 – 2016-10-05 (×19): 3 mL via INTRAVENOUS

## 2016-09-24 MED ORDER — AMOXICILLIN 500 MG PO CAPS
500.0000 mg | ORAL_CAPSULE | Freq: Three times a day (TID) | ORAL | Status: AC
  Administered 2016-09-24 – 2016-09-29 (×15): 500 mg via ORAL
  Filled 2016-09-24 (×15): qty 1

## 2016-09-24 NOTE — Progress Notes (Signed)
[name], female   DOB: [birth year], 25 y.o.   MRN: [id number] [facility] ANTEPARTUM COMPREHENSIVE PROGRESS NOTE  Brittany [name] is a 25 y.o. G1P0000 at 1168w5d  who is admitted for PROM.   Fetal presentation is breech. Length of Stay:  2  Days  Subjective: Pt reports continued leaking of clear fluid. No contractions, abdominal pain, bleeding, fevers. Patient reports good fetal movement.  She reports no uterine contractions.,  Vitals:  Blood pressure (!) 141/81, pulse 74, temperature 98.4 F (36.9 C), temperature source Oral, resp. rate 18, height 5\' 3"  (1.6 m), weight 268 lb (121.6 kg), last menstrual period 02/05/2016, SpO2 98 %. Physical Examination: General appearance - alert, well appearing, and in no distress Heart: regular rate, no murmur Lungs: clear to auscultation bilaterally, no wheezing.  Abdomen - soft, nontender, nondistended, no masses or organomegaly gravid Extremities - peripheral pulses normal, no pedal edema, no clubbing or cyanosis Cervical Exam: Not evaluated.  Membranes:ruptured  Fetal Monitoring:  Baseline: 130's bpm, Variability: Good {> 6 bpm), Accelerations: Reactive and Decelerations: Absent  Labs:  No results found for this or any previous visit (from the past 24 hour(s)).  Imaging Studies:    09/22/16 full report pending  Medications:  Scheduled . ampicillin (OMNIPEN) IV  2 g Intravenous Q6H  . docusate sodium  100 mg Oral Daily  . prenatal multivitamin  1 tablet Oral Q1200   I have reviewed the patient's current medications.  ASSESSMENT: Patient Active Problem List   Diagnosis Date Noted  . Preterm premature rupture of membranes (PPROM) with unknown onset of labor 09/22/2016  . UTI (urinary tract infection) during pregnancy 06/04/2016  . Supervision of normal first pregnancy 04/15/2016  . 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 Obesity     PLAN: 1. PPROM  Continue latency antibiotics.  S/p BMZ 11/12&11/13  Delivery for  maternal or fetal indications or at 34 weeks. 2. HSV1  Just oral lesions  No need for prophylaxis 3. [redacted] weeks gestation  NST reactive x 2  Rhona RaiderJacob J Stinson, DO  09/24/2016,6:51 AM

## 2016-09-25 DIAGNOSIS — O139 Gestational [pregnancy-induced] hypertension without significant proteinuria, unspecified trimester: Secondary | ICD-10-CM

## 2016-09-25 LAB — TYPE AND SCREEN
ABO/RH(D): AB POS
Antibody Screen: NEGATIVE

## 2016-09-25 LAB — COMPREHENSIVE METABOLIC PANEL
ALK PHOS: 82 U/L (ref 38–126)
ALT: 11 U/L — AB (ref 14–54)
ANION GAP: 9 (ref 5–15)
AST: 13 U/L — ABNORMAL LOW (ref 15–41)
Albumin: 2.8 g/dL — ABNORMAL LOW (ref 3.5–5.0)
BILIRUBIN TOTAL: 0.2 mg/dL — AB (ref 0.3–1.2)
BUN: 8 mg/dL (ref 6–20)
CALCIUM: 8.6 mg/dL — AB (ref 8.9–10.3)
CO2: 23 mmol/L (ref 22–32)
CREATININE: 0.62 mg/dL (ref 0.44–1.00)
Chloride: 104 mmol/L (ref 101–111)
Glucose, Bld: 85 mg/dL (ref 65–99)
Potassium: 3.5 mmol/L (ref 3.5–5.1)
Sodium: 136 mmol/L (ref 135–145)
TOTAL PROTEIN: 6.4 g/dL — AB (ref 6.5–8.1)

## 2016-09-25 LAB — PROTEIN / CREATININE RATIO, URINE
CREATININE, URINE: 50 mg/dL
PROTEIN CREATININE RATIO: 0.16 mg/mg{creat} — AB (ref 0.00–0.15)
TOTAL PROTEIN, URINE: 8 mg/dL

## 2016-09-25 LAB — CBC
HCT: 30.6 % — ABNORMAL LOW (ref 36.0–46.0)
Hemoglobin: 9.8 g/dL — ABNORMAL LOW (ref 12.0–15.0)
MCH: 28.6 pg (ref 26.0–34.0)
MCHC: 32 g/dL (ref 30.0–36.0)
MCV: 89.2 fL (ref 78.0–100.0)
PLATELETS: 391 10*3/uL (ref 150–400)
RBC: 3.43 MIL/uL — AB (ref 3.87–5.11)
RDW: 13.4 % (ref 11.5–15.5)
WBC: 13 10*3/uL — ABNORMAL HIGH (ref 4.0–10.5)

## 2016-09-25 MED ORDER — VALACYCLOVIR HCL 500 MG PO TABS
1000.0000 mg | ORAL_TABLET | Freq: Every day | ORAL | Status: DC
  Administered 2016-09-25 – 2016-09-29 (×5): 1000 mg via ORAL
  Filled 2016-09-25 (×8): qty 2

## 2016-09-25 MED ORDER — TETANUS-DIPHTH-ACELL PERTUSSIS 5-2.5-18.5 LF-MCG/0.5 IM SUSP
0.5000 mL | Freq: Once | INTRAMUSCULAR | Status: AC
  Administered 2016-09-25: 0.5 mL via INTRAMUSCULAR
  Filled 2016-09-25 (×3): qty 0.5

## 2016-09-25 NOTE — Progress Notes (Addendum)
Daily Antepartum Note  Admission Date: 09/22/2016 Current Date: 09/25/2016 7:03 AM  Brittany [name] is a 25 y.o. G1 @ 13108w0d, HD#4, admitted for PPROM @ 31/4.  Pregnancy complicated by: H/o HSV1, BMI 48, UTI with neg TOC, gHTN  Overnight/24hr events:  none  Subjective:  No s/s of decreased FM, PTL or infection and no s/s of pre-x  Objective:    Current Vital Signs 24h Vital Sign Ranges  T 97.8 F (36.6 C) Temp  Avg: 97.9 F (36.6 C)  Min: 97.6 F (36.4 C)  Max: 98.3 F (36.8 C)  BP 119/78 BP  Min: 102/64  Max: 147/95  HR 77 Pulse  Avg: 82.2  Min: 76  Max: 88  RR 20 Resp  Avg: 19.7  Min: 18  Max: 20  SaO2 98 % Not Delivered No Data Recorded       24 Hour I/O Current Shift I/O  Time Ins Outs 11/14 0701 - 11/15 0700 In: 3 [I.V.:3] Out: -  No intake/output data recorded.   Patient Vitals for the past 24 hrs:  BP Temp Temp src Pulse Resp  09/25/16 0314 119/78 97.8 F (36.6 C) Oral 77 20  09/25/16 0100 102/64 98 F (36.7 C) Oral 76 20  09/24/16 1941 (!) 147/95 98 F (36.7 C) - 88 20  09/24/16 1541 127/70 98.3 F (36.8 C) Oral 83 20  09/24/16 1201 138/88 97.8 F (36.6 C) Oral 83 18  09/24/16 0852 128/72 97.6 F (36.4 C) Oral 86 20   145 baseline, +accels, no decels, moderate variability No UCs  Physical exam: General: Well nourished, well developed female in no acute distress. Abdomen: gravid, nttp Cardiovascular: S1, S2 normal, no murmur, rub or gallop, regular rate and rhythm Respiratory: CTAB Extremities: no clubbing, cyanosis or edema Skin: Warm and dry.   Medications: Current Facility-Administered Medications  Medication Dose Route Frequency Provider Last Rate Last Dose  . acetaminophen (TYLENOL) tablet 650 mg  650 mg Oral Q4H PRN Montez MoritaMarie D Lawson, CNM   650 mg at 09/22/16 16101852  . amoxicillin (AMOXIL) capsule 500 mg  500 mg Oral Q8H [place] Bingharlie Abrham Maslowski, MD   500 mg at 09/25/16 0601  . calcium carbonate (TUMS - dosed in mg elemental calcium) chewable tablet  400 mg of elemental calcium  2 tablet Oral Q4H PRN Montez MoritaMarie D Lawson, CNM   400 mg of elemental calcium at 09/24/16 1440  . docusate sodium (COLACE) capsule 100 mg  100 mg Oral Daily Montez MoritaMarie D Lawson, CNM   100 mg at 09/24/16 1011  . pantoprazole (PROTONIX) EC tablet 40 mg  40 mg Oral Daily [place] Bingharlie Daven Montz, MD   40 mg at 09/24/16 1618  . prenatal multivitamin tablet 1 tablet  1 tablet Oral Q1200 Montez MoritaMarie D Lawson, CNM   1 tablet at 09/24/16 1151  . sodium chloride flush (NS) 0.9 % injection 3 mL  3 mL Intravenous Q12H [place] Bingharlie Shameria Trimarco, MD   3 mL at 09/24/16 2218  . valACYclovir (VALTREX) tablet 1,000 mg  1,000 mg Oral Daily [place] Bingharlie Willian Donson, MD      . zolpidem (AMBIEN) tablet 5 mg  5 mg Oral QHS PRN Montez MoritaMarie D Lawson, CNM        Labs:   Recent Labs Lab 09/22/16 1655  WBC 12.1*  HGB 10.6*  HCT 32.5*  PLT 390     Recent Labs Lab 09/22/16 1655  NA 136  K 3.6  CL 105  CO2 23  BUN 5*  CREATININE 0.59  CALCIUM  9.2  PROT 6.7  BILITOT 0.1*  ALKPHOS 104  ALT 11*  AST 18  GLUCOSE 93   Radiology: 11/14 breech, efw 80%, 2185gm, normal AC  Assessment & Plan:  Pt stable *Pregnancy: routine care. tdap ordered. qday NSTs *PPROM: continue latency abx. -s/p BMZ 11/12-13 *Preterm: NICU consulted *Malpresentation: d/w pt need for c-section if need for delivery *gHTN: will get pre-x labs to ensure just gHTN; precautions given *PPx: SCDs, OOB ad lib *FEN/GI: regular diet *Dispo: delivery at 34wks  Cornelia Copaharlie Oziel Beitler, Jr. MD Attending Center for [facility](Faculty Practice)

## 2016-09-26 NOTE — Progress Notes (Signed)
[facility] ANTEPARTUM COMPREHENSIVE PROGRESS NOTE  Brittany [name] is a 25 y.o. G1P0000 at 4966w1d  who is admitted for PPROM.   Fetal presentation is breech. Length of Stay:  4  Days  Subjective: Pt continue to have some LOF. Denies Vb, cramps, ut ctx or vaginal pressure. Reports + Fm.    Vitals:  Blood pressure 139/83, pulse 69, temperature 98.5 F (36.9 C), temperature source Oral, resp. rate 16, height 5\' 3"  (1.6 m), weight 120.7 kg (266 lb 3.2 oz), last menstrual period 02/05/2016, SpO2 98 %. Physical Examination: Lungs clear  Heart RRR Abd soft + BS gravid non tender SVE deferred   Ext non tender  Fetal Monitoring:  RNST  Labs:  Results for orders placed or performed during the hospital encounter of 09/22/16 (from the past 24 hour(s))  CBC   Collection Time: 09/25/16  7:13 AM  Result Value Ref Range   WBC 13.0 (H) 4.0 - 10.5 K/uL   RBC 3.43 (L) 3.87 - 5.11 MIL/uL   Hemoglobin 9.8 (L) 12.0 - 15.0 g/dL   HCT 09.830.6 (L) 11.936.0 - 14.746.0 %   MCV 89.2 78.0 - 100.0 fL   MCH 28.6 26.0 - 34.0 pg   MCHC 32.0 30.0 - 36.0 g/dL   RDW 82.913.4 56.211.5 - 13.015.5 %   Platelets 391 150 - 400 K/uL  Comprehensive metabolic panel   Collection Time: 09/25/16  7:13 AM  Result Value Ref Range   Sodium 136 135 - 145 mmol/L   Potassium 3.5 3.5 - 5.1 mmol/L   Chloride 104 101 - 111 mmol/L   CO2 23 22 - 32 mmol/L   Glucose, Bld 85 65 - 99 mg/dL   BUN 8 6 - 20 mg/dL   Creatinine, Ser 8.650.62 0.44 - 1.00 mg/dL   Calcium 8.6 (L) 8.9 - 10.3 mg/dL   Total Protein 6.4 (L) 6.5 - 8.1 g/dL   Albumin 2.8 (L) 3.5 - 5.0 g/dL   AST 13 (L) 15 - 41 U/L   ALT 11 (L) 14 - 54 U/L   Alkaline Phosphatase 82 38 - 126 U/L   Total Bilirubin 0.2 (L) 0.3 - 1.2 mg/dL   GFR calc non Af Amer >60 >60 mL/min   GFR calc Af Amer >60 >60 mL/min   Anion gap 9 5 - 15  Type and screen [facility] [place]   Collection Time: 09/25/16  7:21 AM  Result Value Ref Range   ABO/RH(D) AB POS    Antibody Screen NEG    Sample  Expiration 09/28/2016   Protein / creatinine ratio, urine   Collection Time: 09/25/16  8:00 AM  Result Value Ref Range   Creatinine, Urine 50.00 mg/dL   Total Protein, Urine 8 mg/dL   Protein Creatinine Ratio 0.16 (H) 0.00 - 0.15 mg/mg[Cre]    Imaging Studies:       Medications:  Scheduled . amoxicillin  500 mg Oral Q8H  . docusate sodium  100 mg Oral Daily  . pantoprazole  40 mg Oral Daily  . prenatal multivitamin  1 tablet Oral Q1200  . sodium chloride flush  3 mL Intravenous Q12H  . valACYclovir  1,000 mg Oral Daily   I have reviewed the patient's current medications.  ASSESSMENT: Patient Active Problem List   Diagnosis Date Noted  . Gestational hypertension 09/25/2016  . Preterm premature rupture of membranes (PPROM) with unknown onset of labor 09/22/2016  . UTI (urinary tract infection) during pregnancy 06/04/2016  . Supervision of normal  first pregnancy 04/15/2016  . 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 Obesity     PLAN: IUP 32 1/7 wks PPROM H/O GHTN  S/P BMZ. Latency antibiotics. Continue to monitor Bp. PEC labs WNL Continue routine antenatal care.   Hermina StaggersMichael L Laury Huizar 09/26/2016,6:48 AM

## 2016-09-27 NOTE — Progress Notes (Signed)
[name], female   DOB: Apr 05, 1991, 25 y.o.   MRN: [id number] [facility] ANTEPARTUM COMPREHENSIVE PROGRESS NOTE  Brittany Katomily N Trusty is a 25 y.o. G1P0000 at 1559w2d  who is admitted for PROM.   Fetal presentation is breech. Length of Stay:  5  Days  Subjective: Pt with no new complaints. Patient reports good fetal movement.  She reports no uterine contractions, and no bleeding. Still reports loss of fluid per vagina.  Vitals:  Blood pressure 140/89, pulse 91, temperature 97.6 F (36.4 C), temperature source Oral, resp. rate 18, height 5\' 3"  (1.6 m), weight 266 lb 3.2 oz (120.7 kg), last menstrual period 02/05/2016, SpO2 98 %. Physical Examination: General appearance - alert, well appearing, and in no distress Abdomen - soft, nontender, nondistended, no masses or organomegaly Extremities - no pedal edema noted Cervical Exam: Not evaluated.  Membranes:ruptured, clear fluid  Fetal Monitoring:  Baseline: 150's bpm, Variability: Good {> 6 bpm), Accelerations: Reactive, Decelerations: Absent and TOCO: no ctx  Labs:  No results found for this or any previous visit (from the past 24 hour(s)).  Imaging Studies:    None pending   Medications:  Scheduled . amoxicillin  500 mg Oral Q8H  . docusate sodium  100 mg Oral Daily  . pantoprazole  40 mg Oral Daily  . prenatal multivitamin  1 tablet Oral Q1200  . sodium chloride flush  3 mL Intravenous Q12H  . valACYclovir  1,000 mg Oral Daily   I have reviewed the patient's current medications.  ASSESSMENT: Patient Active Problem List   Diagnosis Date Noted  . Gestational hypertension 09/25/2016  . Preterm premature rupture of membranes (PPROM) with unknown onset of labor 09/22/2016  . UTI (urinary tract infection) during pregnancy 06/04/2016  . Supervision of normal first pregnancy 04/15/2016  . 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 Obesity     PLAN: Cont atbx for latency s/p BMZ course Magnesium sulfate if delivery imminent  until 32 weeks Delivery at 34 weeks or sooner for adverse fetal or maternal concerns Continue routine antenatal care.  HARRAWAY-SMITH, Maleak Brazzel 09/27/2016,6:48 AM

## 2016-09-27 NOTE — Consult Note (Signed)
[facility]  Consultation Service: Neonatology   Dr. Ihor Dow has asked for consultation on Ms. Gryder with MRN# [id number] regarding the care of a premature infant at 42 weeks. Thank you for inviting [place] to see this patient.   Reason for consult:  Explain the possible complications, the prognosis, and the care of a premature infant at 65 weeks.   Chief complaint: 25yo female with a 32 2/7 week  IUP with an estimated weight of 2185 grams.   My key findings of this patient's HPI are:  I have reviewed the patient's chart and have met with her. The salient information is as follows:  Presented at 2 4/[redacted] weeks EGA with LOF, no VB or PTL.  Reassuring fetal stress test. BTMZ and latency abx given.  No chorio concerns. 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   Prenatal labs:  ABO, Rh: AB/Positive/-- (06/05 1631) Antibody: Negative (10/18 0919) Rubella: 2.08 (06/05 1631) RPR: Non Reactive (10/18 0919)  HBsAg: Negative (06/05 1631)  HIV: Non Reactive (10/18 0919)  GBS:   negative 11/12   Prenatal care:   good Pregnancy complications:  gestational HTN, obesity Maternal antibiotics: This patient's mother is not on file. Maternal Steroids: yes   Most recent dose:  11/13    My recommendations for this patient and my actions included:   1. In the presence of the mother and mGM, I spent 25 minutes discussing the possible complications and outcomes of prematurity at this gestational age. I gave the patient a March of Dimes handout, written in lay language, that discussed the common complications and survival data of the premature infant and a summary handout with graph and table. I discussed the potential need for resuscitation at birth, mechanical ventilation and surfactant administration for respiratory distress, IV fluids pending establishment of enteral feeds (encouraged breast milk feeding to which she  planned to do), antibiotics for possible sepsis, temperature support, and monitoring. I also discussed the potential risk of complications such as intracranial hemorrhage, retinopathy, hearing deficit, and chronic lung disease. I also discussed the potential length of stay in the neonatal intensive care unit for about 7 weeks. I discussed this with parents in detail and they expressed an understanding of the risks and complications of prematurity.   2. I also discussed the expected survival of an infant born at 70 weeks, which is near 98%. We further discussed that roughly 3% of neonates born at this age have severe neurological complications and that around 30% have school difficulties. She expressed an understanding of this information.   3. I informed her that the NICU team would be present at the delivery. She agreed that all appropriate medical measures could be taken to resuscitate her infant at the delivery. She also understood that depending on her infant's initial condition and NICU course, some difficult decisions may have to be made.   4. A visit to the NICU by the infant's mother and/or a significant other was encouraged. Visitation policy was discussed.   Final Impression:  25yo  female with a 47 week IUP who is threatening to deliver and who now understands the possible complications and prognosis of her infant.     ______________________________________________________________________  Thank you for asking [place] to participate in the care of this patient. Please do not hesitate to contact us again if you are aware of any further ways we can be of assistance.   Sincerely,  Monia Sabal. Katherina Mires, MD  Neonatologist  I spent ~40 minutes in consultation time, of which 25 minutes was spent in direct face to face counseling.

## 2016-09-27 NOTE — Progress Notes (Signed)
UR chart review completed.  

## 2016-09-28 NOTE — Progress Notes (Signed)
NST completed.  Reactive and reassuring.  Second reviewer Dr Genevie AnnSchenk.  Requested WC rides for pt.

## 2016-09-28 NOTE — Progress Notes (Signed)
FACULTY PRACTICE ANTEPARTUM(COMPREHENSIVE) NOTE  Brittany [name] is a 25 y.o. G1P0000 at 4751w3d by LMP who is admitted for rupture of membranes.   Fetal presentation is breech. Length of Stay:  6  Days  Subjective:  Patient reports the fetal movement as active. Patient reports uterine contraction  activity as rare. Patient reports  vaginal bleeding as none. Patient describes fluid per vagina as Clear.  Vitals:  Blood pressure 117/75, pulse 90, temperature 99.2 F (37.3 C), temperature source Oral, resp. rate 20, height 5\' 3"  (1.6 m), weight 120.7 kg (266 lb 3.2 oz), last menstrual period 02/05/2016, SpO2 99 %. Physical Examination:  General appearance - alert, well appearing, and in no distress Heart - normal rate and regular rhythm Abdomen - soft, nontender, nondistended Fundal Height:  size equals dates Cervical Exam: Not evaluated .Extremities: extremities normal, atraumatic, no cyanosis or edema and Homans sign is negative, no sign of DVT with  Membranes:ruptured, clear fluid  Fetal Monitoring:   Fetal Heart Rate A  Mode External filed at 09/27/2016 1116  Baseline Rate (A) 140 bpm filed at 09/27/2016 1116  Variability 6-25 BPM filed at 09/27/2016 1116  Accelerations 15 x 15 filed at 09/27/2016 1116  Decelerations Variable filed at 09/27/2016 1116  Multiple birth? N filed at 09/26/2016 [id number]     Labs:  No results found for this or any previous visit (from the past 24 hour(s)).    Medications:  Scheduled . amoxicillin  500 mg Oral Q8H  . docusate sodium  100 mg Oral Daily  . pantoprazole  40 mg Oral Daily  . prenatal multivitamin  1 tablet Oral Q1200  . sodium chloride flush  3 mL Intravenous Q12H  . valACYclovir  1,000 mg Oral Daily   I have reviewed the patient's current medications.  ASSESSMENT: Patient Active Problem List   Diagnosis Date Noted  . Gestational hypertension 09/25/2016  . Preterm premature rupture of membranes (PPROM) with unknown onset of labor  09/22/2016  . UTI (urinary tract infection) during pregnancy 06/04/2016  . Supervision of normal first pregnancy 04/15/2016  . 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 Obesity     PLAN: Cont atbx for latency s/p BMZ course Magnesium sulfate if delivery imminent until 32 weeks Delivery at 34 weeks or sooner for adverse fetal or maternal concerns Continue routine antenatal care.  Karan Inclan 09/28/2016,7:36 AM

## 2016-09-29 NOTE — Progress Notes (Signed)
[name], female   DOB: 1991/04/04, [age] y.o.   MRN: [id number] FACULTY PRACTICE ANTEPARTUM(COMPREHENSIVE) NOTE  Brittany [name] is a 25 y.o. G1P0000 at 6243w4d by best clinical estimate who is admitted for PROM.   Fetal presentation is breech. Length of Stay:  7  Days  Subjective: Feels well Patient reports the fetal movement as active. Patient reports uterine contraction  activity as none. Patient reports  vaginal bleeding as none. Patient describes fluid per vagina as Clear.  Vitals:  Blood pressure 119/66, pulse 78, temperature 98.2 F (36.8 C), temperature source Oral, resp. rate 20, height 5\' 3"  (1.6 m), weight 266 lb 3.2 oz (120.7 kg), last menstrual period 02/05/2016, SpO2 100 %. Physical Examination:  General appearance - alert, well appearing, and in no distress Chest - normal effort Abdomen - gravid, NT Fundal Height:  size equals dates Extremities: Homans sign is negative, no sign of DVT  Membranes:ruptured, clear fluid  Fetal Monitoring:  Baseline: 150 bpm, Variability: Good {> 6 bpm), Accelerations: Reactive and Decelerations: Absent   Medications:  Scheduled . amoxicillin  500 mg Oral Q8H  . docusate sodium  100 mg Oral Daily  . pantoprazole  40 mg Oral Daily  . prenatal multivitamin  1 tablet Oral Q1200  . sodium chloride flush  3 mL Intravenous Q12H  . valACYclovir  1,000 mg Oral Daily   I have reviewed the patient's current medications.  ASSESSMENT: Active Problems:   Preterm premature rupture of membranes (PPROM) with unknown onset of labor   Gestational hypertension   PLAN: Continue inpatient monitoring Abx to extend latent period Delivery at 34 wks or s/sx's of triple I BP ok  Reva Boresanya S Pratt, MD 09/29/2016,6:56 AM

## 2016-09-29 NOTE — Progress Notes (Signed)
Patient placed on EFM. Patient denies having any contractions. Positive fetal movement. Carmelina DaneERRI L Shakeria Robinette, RN

## 2016-09-30 NOTE — Progress Notes (Signed)
[name], female   DOB: Jun 30, 1991, 25 y.o.   MRN: [id number] FACULTY PRACTICE ANTEPARTUM(COMPREHENSIVE) NOTE  Brittany [name] is a 25 y.o. G1P0000 at 3625w5d by best clinical estimate who is admitted for PROM.   Fetal presentation is breech. Length of Stay:  8  Days  Subjective: Feels well Patient reports the fetal movement as active. Patient reports uterine contraction  activity as none. Patient reports  vaginal bleeding as none. Patient describes fluid per vagina as Clear.  Vitals:  Blood pressure 122/62, pulse 92, temperature 98.9 F (37.2 C), temperature source Oral, resp. rate 18, height 5\' 3"  (1.6 m), weight 266 lb 3.2 oz (120.7 kg), last menstrual period 02/05/2016, SpO2 100 %. Physical Examination:  General appearance - alert, well appearing, and in no distress Chest - normal effort Abdomen - gravid, reports mild tenderness with palpation of the uterus Fundal Height:  size equals dates. Extremities: Homans sign is negative, no sign of DVT  Membranes:ruptured, clear fluid  Fetal Monitoring:  Baseline: 140 bpm, Variability: Good {> 6 bpm), Accelerations: Reactive and Decelerations: Absent  Medications:  Scheduled . docusate sodium  100 mg Oral Daily  . pantoprazole  40 mg Oral Daily  . prenatal multivitamin  1 tablet Oral Q1200  . sodium chloride flush  3 mL Intravenous Q12H  . valACYclovir  1,000 mg Oral Daily   I have reviewed the patient's current medications.  ASSESSMENT: Active Problems:   Preterm premature rupture of membranes (PPROM) with unknown onset of labor   Gestational hypertension   PLAN: Continue inpatient Delivery at 34 wks or with s/sx's of infection S/p Abx and s/p BMZ  Reva Boresanya S Skye Plamondon, MD 09/30/2016,6:53 AM

## 2016-09-30 NOTE — Progress Notes (Signed)
Initial Nutrition Assessment  DOCUMENTATION CODES:   Morbid obesity  INTERVENTION:  Regular diet May order double protein portions, snacks TID and from retail  NUTRITION DIAGNOSIS:   Increased nutrient needs related to  (pregnancy and fetal growth requirements) as evidenced by  (32 weeks IUP).  GOAL:  Patient will meet greater than or equal to 90% of their needs  MONITOR:  Weight trends  REASON FOR ASSESSMENT:   Antenatal   ASSESSMENT:   32 5/7 weeks, PROM. pre-preg weight 244 Lbs, BMI 43.2. 25 Lb weight gain. Pt reports good appetite. No complaints  Diet Order:  Diet regular Room service appropriate? Yes; Fluid consistency: Thin  Skin:  Reviewed, no issues   Height:   Ht Readings from Last 1 Encounters:  09/22/16 5\' 3"  (1.6 m)    Weight:   Wt Readings from Last 1 Encounters:  09/25/16 266 lb 3.2 oz (120.7 kg)    Ideal Body Weight:    115 lbs BMI:  Body mass index is 47.16 kg/m.  Estimated Nutritional Needs:   Kcal:  2300-2500  Protein:  105-115 g  Fluid:  2.6 L  EDUCATION NEEDS:   No education needs identified at this time  Inez PilgrimKatherine Lebert Lovern M.Odis LusterEd. R.D. LDN Neonatal Nutrition Support Specialist/RD III Pager [phone]      Phone [phone]

## 2016-10-01 DIAGNOSIS — O133 Gestational [pregnancy-induced] hypertension without significant proteinuria, third trimester: Secondary | ICD-10-CM

## 2016-10-01 DIAGNOSIS — Z3A32 32 weeks gestation of pregnancy: Secondary | ICD-10-CM

## 2016-10-01 NOTE — Progress Notes (Signed)
[name], female   DOB: 1991-04-30, 25 y.o.   MRN: [id number] ACULTY PRACTICE ANTEPARTUM COMPREHENSIVE PROGRESS NOTE  Brittany [name] is a 25 y.o. G1P0000 at 694w6d  who is admitted for PROM.   Fetal presentation is breech. Length of Stay:  9  Days  Subjective: Pt reports continued leaking of clear fluid. No contractions, abdominal pain, bleeding, fevers. Patient reports good fetal movement.  She reports no uterine contractions.,  Vitals:  Blood pressure 124/83, pulse 91, temperature 98.3 F (36.8 C), temperature source Oral, resp. rate 16, height 5\' 3"  (1.6 m), weight 266 lb 3.2 oz (120.7 kg), last menstrual period 02/05/2016, SpO2 100 %. Physical Examination: General appearance - alert, well appearing, and in no distress Heart: regular rate, no murmur Lungs: clear to auscultation bilaterally, no wheezing.  Abdomen - soft, nontender, nondistended, no masses or organomegaly gravid Extremities - peripheral pulses normal, no pedal edema, no clubbing or cyanosis Cervical Exam: Not evaluated.  Membranes:ruptured  Fetal Monitoring:  Baseline: 145 bpm, Variability: Good {> 6 bpm), Accelerations: Reactive and Decelerations: Absent x2  Labs:  Results for orders placed or performed during the hospital encounter of 09/22/16 (from the past 24 hour(s))  [facility] and screen [facility]   Collection Time: 09/30/16  7:44 AM  Result Value Ref Range   ABO/RH(D) AB POS    Antibody Screen NEG    Sample Expiration 10/03/2016     Imaging Studies:    09/22/16 full report pending  Medications:  Scheduled . docusate sodium  100 mg Oral Daily  . pantoprazole  40 mg Oral Daily  . prenatal multivitamin  1 tablet Oral Q1200  . sodium chloride flush  3 mL Intravenous Q12H  . valACYclovir  1,000 mg Oral Daily   I have reviewed the patient's current medications.  ASSESSMENT: Patient Active Problem List   Diagnosis Date Noted  . Gestational hypertension 09/25/2016  .  Preterm premature rupture of membranes (PPROM) with unknown onset of labor 09/22/2016  . UTI (urinary tract infection) during pregnancy 06/04/2016  . Supervision of normal first pregnancy 04/15/2016  . 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 Obesity     PLAN: 1. PPROM  Continue latency antibiotics.  S/p BMZ 11/12&11/13  Delivery for maternal or fetal indications or at 34 weeks. 2. [redacted] weeks gestation  NST reactive x 2  Rhona RaiderJacob J Tarance Balan, DO  10/01/2016,6:35 AM

## 2016-10-01 NOTE — Progress Notes (Signed)
I offered initial support to pt and her family (FOB and her cousin) who are all in good spirits.  They are just eager to get to the date for planned delivery (11/29) and reported no other concerns or needs at this time.  Chaplain Dyanne CarrelKaty Marcus Schwandt, Bcc Pager, [phone] 11:55 AM    10/01/16 1100  Clinical Encounter Type  Visited With Patient and family together  Visit Type Initial

## 2016-10-02 NOTE — Progress Notes (Signed)
[name], female   DOB: 1991-08-21, 25 y.o.   MRN: [id number] ACULTY PRACTICE ANTEPARTUM COMPREHENSIVE PROGRESS NOTE  Brittany [name] is a 25 y.o. G1P0000 at 7557w0d  who is admitted for PROM.   Fetal presentation is breech. Length of Stay:  10  Days  Subjective: Pt reports continued leaking of clear fluid and some spotting. No contractions, abdominal pain, fevers.  Patient reports good fetal movement.  She reports no uterine contractions.,  Vitals:  Blood pressure 118/73, pulse 95, temperature 98.8 F (37.1 C), temperature source Oral, resp. rate 18, height 5\' 3"  (1.6 m), weight 266 lb 3.2 oz (120.7 kg), last menstrual period 02/05/2016, SpO2 100 %. Physical Examination: General appearance - alert, well appearing, and in no distress Heart: regular rate, no murmur Lungs: clear to auscultation bilaterally, no wheezing.  Abdomen - soft, gravid, nontender, nondistended, no masses or organomegaly Extremities - peripheral pulses normal, no pedal edema, no clubbing or cyanosis Cervical Exam: Not evaluated.  Membranes:ruptured  Fetal Monitoring:  Baseline: 145 bpm, Variability: Good {> 6 bpm), Accelerations: Reactive and Decelerations: Absent   Labs:  No results found for this or any previous visit (from the past 24 hour(s)).  Imaging Studies:    09/22/16 full report pending  Medications:  Scheduled . docusate sodium  100 mg Oral Daily  . pantoprazole  40 mg Oral Daily  . prenatal multivitamin  1 tablet Oral Q1200  . sodium chloride flush  3 mL Intravenous Q12H  . valACYclovir  1,000 mg Oral Daily   I have reviewed the patient's current medications.  ASSESSMENT: Patient Active Problem List   Diagnosis Date Noted  . Gestational hypertension 09/25/2016  . Preterm premature rupture of membranes (PPROM) with unknown onset of labor 09/22/2016  . UTI (urinary tract infection) during pregnancy 06/04/2016  . Supervision of normal first pregnancy 04/15/2016  . HSV-1  infection 04/15/2016  . Obesity     PLAN: 1. PPROM  Continue latency antibiotics.  S/p BMZ 11/12&11/13  Delivery for maternal or fetal indications or at 34 weeks. 2. [redacted] weeks gestation  NST reactive every shift  Continue routine care  Tereso NewcomerUgonna A Anyanwu, MD  10/02/2016,7:04 AM

## 2016-10-03 LAB — TYPE AND SCREEN
ABO/RH(D): AB POS
ABO/RH(D): AB POS
ANTIBODY SCREEN: NEGATIVE
Antibody Screen: NEGATIVE

## 2016-10-03 NOTE — Progress Notes (Signed)
Daily Antepartum Note  Admission Date: 09/22/2016 Current Date: 10/03/2016 6:57 AM  Jamie KatoEmily N Wohlford is a 25 y.o. G1 @ 4220w0d, HD#10, admitted for PPROM @ 31/4.  Pregnancy complicated by: H/o HSV1, BMI 48, UTI with neg TOC, gHTN, breech  Overnight/24hr events:  none  Subjective:  No s/s of infection or preterm labor  Objective:    Current Vital Signs 24h Vital Sign Ranges  T 98.4 F (36.9 C) Temp  Avg: 98.4 F (36.9 C)  Min: 98 F (36.7 C)  Max: 98.6 F (37 C)  BP 105/68 BP  Min: 105/68  Max: 114/73  HR 90 Pulse  Avg: 96.5  Min: 90  Max: 103  RR 18 Resp  Avg: 19  Min: 18  Max: 20  SaO2 100 % Not Delivered No Data Recorded       24 Hour I/O Current Shift I/O  Time Ins Outs 11/22 0701 - 11/23 0700 In: 3 [I.V.:3] Out: -  No intake/output data recorded.   Patient Vitals for the past 24 hrs:  BP Temp Temp src Pulse Resp Weight  10/02/16 1952 105/68 98.4 F (36.9 C) Oral 90 18 -  10/02/16 1600 (!) 113/58 98.6 F (37 C) - (!) 103 20 -  10/02/16 1220 114/73 98.5 F (36.9 C) Oral 100 18 -  10/02/16 1029 - - - - - 120.2 kg (265 lb 1.6 oz)  10/02/16 0917 106/75 98 F (36.7 C) Oral 93 20 -   145 baseline, +accels, no decel, moderate variability No UCs  Physical exam: General: Well nourished, well developed female in no acute distress. Abdomen: gravid, nttp Cardiovascular: S1, S2 normal, no murmur, rub or gallop, regular rate and rhythm Respiratory: CTAB Extremities: no clubbing, cyanosis or edema Skin: Warm and dry.   Medications: Current Facility-Administered Medications  Medication Dose Route Frequency Provider Last Rate Last Dose  . acetaminophen (TYLENOL) tablet 650 mg  650 mg Oral Q4H PRN Montez MoritaMarie D Lawson, CNM   650 mg at 09/22/16 [id number]  . calcium carbonate (TUMS - dosed in mg elemental calcium) chewable tablet 400 mg of elemental calcium  2 tablet Oral Q4H PRN Montez MoritaMarie D Lawson, CNM   400 mg of elemental calcium at 09/24/16 1440  . docusate sodium (COLACE) capsule  100 mg  100 mg Oral Daily Montez MoritaMarie D Lawson, CNM   100 mg at 10/02/16 [id number]  . pantoprazole (PROTONIX) EC tablet 40 mg  40 mg Oral Daily [place] Bingharlie Nieves Chapa, MD   40 mg at 10/02/16 0946  . prenatal multivitamin tablet 1 tablet  1 tablet Oral Q1200 Montez MoritaMarie D Lawson, CNM   1 tablet at 10/02/16 0946  . sodium chloride flush (NS) 0.9 % injection 3 mL  3 mL Intravenous Q12H [place] Bingharlie Kalen Ratajczak, MD   3 mL at 10/02/16 0946  . valACYclovir (VALTREX) tablet 1,000 mg  1,000 mg Oral Daily  Bingharlie Turner Baillie, MD   1,000 mg at 09/29/16 0942  . zolpidem (AMBIEN) tablet 5 mg  5 mg Oral QHS PRN Montez MoritaMarie D Lawson, CNM        Labs:  No new labs  Radiology: 11/14 breech, efw 80%, 2185gm, normal AC  Assessment & Plan:  Pt stable *Pregnancy: routine care. qday NSTs -s/p tdap *PPROM: needs c/s scheduled for 34/0 -s/p latency abx *Preterm: seen by NICU, s/p bmz 11/12-13 *Malpresentation: see above *gHTN: no issues *h/o HSV1: on valtrex ppx *PPx: SCDs, OOB ad lib *FEN/GI: regular diet *Dispo: delivery at 34wks  Cornelia Copaharlie Shadai Mcclane, Jr. MD [facility]ending [facility], mental health [facility] farm manager)

## 2016-10-04 MED ORDER — CYCLOBENZAPRINE HCL 10 MG PO TABS
10.0000 mg | ORAL_TABLET | ORAL | Status: AC
  Administered 2016-10-04: 10 mg via ORAL
  Filled 2016-10-04: qty 1

## 2016-10-04 NOTE — Progress Notes (Signed)
FACULTY PRACTICE ANTEPARTUM(COMPREHENSIVE) NOTE  Brittany [name] is a 25 y.o. G1P0000 at 6859w2d  who is admitted for rupture of membranes, PROM.   Fetal presentation is breech. scheduled for section on Wednesday, 10/09/2016 Length of Stay:  12  Days  Subjective: Pt had pressure for a while this morning, similar to yesterday, EFM shows no labor Patient reports the fetal movement as active. Patient reports uterine contraction  activity as none. Patient reports  vaginal bleeding as none. Patient describes fluid per vagina as Clear.  Vitals:  Blood pressure 128/80, pulse 80, temperature 98.3 F (36.8 C), temperature source Oral, resp. rate 17, height 5\' 3"  (1.6 m), weight 120.2 kg (265 lb 1.6 oz), last menstrual period 02/05/2016, SpO2 100 %. Physical Examination:  General appearance - alert, well appearing, and in no distress, oriented to person, place, and time and overweight Heart - normal rate and regular rhythm Abdomen - soft, nontender, nondistended Fundal Height:  size equals dates Cervical Exam: Not evaluated. andExtremities: extremities normal, atraumatic, no cyanosis or edema and Homans sign is negative, no sign of DVT with DTRs 2+ bilaterally Membranes:ruptured  Fetal Monitoring:  Cat I reactive  Labs:  No results found for this or any previous visit (from the past 24 hour(s)).  Imaging Studies:     Currently EPIC will not allow sonographic studies to automatically populate into notes.  In the meantime, copy and paste results into note or free text.  Medications:  Scheduled . docusate sodium  100 mg Oral Daily  . pantoprazole  40 mg Oral Daily  . prenatal multivitamin  1 tablet Oral Q1200  . sodium chloride flush  3 mL Intravenous Q12H  . valACYclovir  1,000 mg Oral Daily   I have reviewed the patient's current medications.  ASSESSMENT: Patient Active Problem List   Diagnosis Date Noted  . Gestational hypertension 09/25/2016  . Preterm premature rupture of membranes  (PPROM) with unknown onset of labor 09/22/2016  . UTI (urinary tract infection) during pregnancy 06/04/2016  . Supervision of normal first pregnancy 04/15/2016  . 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 Obesity     PLAN: Information regarding scheduling of section given to administrative coordinator today, to be posted for 10/09/2016 1. PPROM  Completed latency antibiotics.  S/p BMZ 11/12&11/13  Delivery for maternal or fetal indications or at 34 weeks., 10/09/2016 2. [redacted] weeks gestation  NST reactive every shift  Continue routine care as inpatient until delivery    Elihu Milstein V 10/04/2016,10:00 AM    Patient ID: Brittany Katomily N Hotard, female   DOB: August 03, 1991, 25 y.o.   MRN: [id number]

## 2016-10-05 MED ORDER — CYCLOBENZAPRINE HCL 10 MG PO TABS
10.0000 mg | ORAL_TABLET | Freq: Three times a day (TID) | ORAL | Status: DC | PRN
  Administered 2016-10-06: 10 mg via ORAL
  Filled 2016-10-05 (×2): qty 1

## 2016-10-05 NOTE — Progress Notes (Signed)
FACULTY PRACTICE ANTEPARTUM(COMPREHENSIVE) NOTE  Brittany [name] is a 25 y.o. G1P0000 at 2951w2d  who is admitted for rupture of membranes, PROM.   Fetal presentation is breech. scheduled for section on Wednesday, 10/09/2016 Length of Stay:  13  Days  Subjective: Pt had pressure for a while this morning, similar to yesterday, EFM shows no labor Patient reports the fetal movement as active. Patient reports uterine contraction  activity as none. Patient reports  vaginal bleeding as none. Patient describes fluid per vagina as Clear.  Vitals:  Blood pressure 124/80, pulse 97, temperature 98.6 F (37 C), temperature source Oral, resp. rate 16, height 5\' 3"  (1.6 m), weight 265 lb 1.6 oz (120.2 kg), last menstrual period 02/05/2016, SpO2 100 %. Physical Examination:  General appearance - alert, well appearing, and in no distress, oriented to person, place, and time and overweight Heart - normal rate and regular rhythm Abdomen - soft, nontender, nondistended Fundal Height:  size equals dates Cervical Exam: Not evaluated. andExtremities: extremities normal, atraumatic, no cyanosis or edema and Homans sign is negative, no sign of DVT with DTRs 2+ bilaterally Membranes:ruptured  Fetal Monitoring:  Cat I reactive  Labs:  No results found for this or any previous visit (from the past 24 hour(s)).  Imaging Studies:     Currently EPIC will not allow sonographic studies to automatically populate into notes.  In the meantime, copy and paste results into note or free text.  Medications:  Scheduled . docusate sodium  100 mg Oral Daily  . pantoprazole  40 mg Oral Daily  . prenatal multivitamin  1 tablet Oral Q1200  . sodium chloride flush  3 mL Intravenous Q12H  . valACYclovir  1,000 mg Oral Daily   I have reviewed the patient's current medications.  ASSESSMENT: Patient Active Problem List   Diagnosis Date Noted  . Gestational hypertension 09/25/2016  . Preterm premature rupture of membranes  (PPROM) with unknown onset of labor 09/22/2016  . UTI (urinary tract infection) during pregnancy 06/04/2016  . Supervision of normal first pregnancy 04/15/2016  . 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 Obesity     PLAN: Information regarding scheduling of section given to administrative coordinator today, to be posted for 10/09/2016 1. PPROM  Completed latency antibiotics.  S/p BMZ 11/12&11/13  Delivery for maternal or fetal indications or at 34 weeks., 10/09/2016 2. [redacted] weeks gestation  NST reactive every shift  Continue routine care as inpatient until delivery    Lavonna Lampron H 10/05/2016,8:29 AM    [name], female   DOB: [birth year], 25 y.o.   MRN: [id number] [name], female   DOB: [birth year], 25 y.o.   MRN: [id number]

## 2016-10-06 ENCOUNTER — Inpatient Hospital Stay (HOSPITAL_COMMUNITY): Payer: Managed Care, Other (non HMO) | Admitting: Anesthesiology

## 2016-10-06 ENCOUNTER — Encounter (HOSPITAL_COMMUNITY): Payer: Self-pay | Admitting: Certified Registered Nurse Anesthetist

## 2016-10-06 ENCOUNTER — Encounter (HOSPITAL_COMMUNITY)
Admission: AD | Disposition: A | Discharge status: Home / Self Care | Payer: Self-pay | Source: Ambulatory Visit | Attending: Obstetrics & Gynecology

## 2016-10-06 LAB — TYPE AND SCREEN
ABO/RH(D): AB POS
Antibody Screen: NEGATIVE

## 2016-10-06 SURGERY — Surgical Case
Anesthesia: Spinal

## 2016-10-06 MED ORDER — ACETAMINOPHEN 500 MG PO TABS
1000.0000 mg | ORAL_TABLET | Freq: Four times a day (QID) | ORAL | Status: AC
  Administered 2016-10-06 – 2016-10-07 (×4): 1000 mg via ORAL
  Filled 2016-10-06 (×4): qty 2

## 2016-10-06 MED ORDER — SODIUM CHLORIDE 0.9% FLUSH: 3.0000 mL | INTRAVENOUS | Status: DC | PRN

## 2016-10-06 MED ORDER — PROMETHAZINE HCL 25 MG/ML IJ SOLN: 6.2500 mg | INTRAMUSCULAR | Status: DC | PRN

## 2016-10-06 MED ORDER — MORPHINE SULFATE-NACL 0.5-0.9 MG/ML-% IV SOSY
PREFILLED_SYRINGE | INTRAVENOUS | Status: AC
  Filled 2016-10-06: qty 1

## 2016-10-06 MED ORDER — OXYTOCIN 10 UNIT/ML IJ SOLN
INTRAMUSCULAR | Status: AC
  Filled 2016-10-06: qty 4

## 2016-10-06 MED ORDER — SIMETHICONE 80 MG PO CHEW: 80.0000 mg | CHEWABLE_TABLET | ORAL | Status: DC | PRN

## 2016-10-06 MED ORDER — WITCH HAZEL-GLYCERIN EX PADS: 1.0000 "application " | MEDICATED_PAD | CUTANEOUS | Status: DC | PRN

## 2016-10-06 MED ORDER — FENTANYL CITRATE (PF) 100 MCG/2ML IJ SOLN
INTRAMUSCULAR | Status: DC | PRN
  Administered 2016-10-06: 10 ug via INTRATHECAL

## 2016-10-06 MED ORDER — SODIUM CHLORIDE 0.9% FLUSH
INTRAVENOUS | Status: AC
  Filled 2016-10-06: qty 3

## 2016-10-06 MED ORDER — TERBUTALINE SULFATE 1 MG/ML IJ SOLN
0.2500 mg | Freq: Once | INTRAMUSCULAR | Status: AC
  Administered 2016-10-06: 0.25 mg via SUBCUTANEOUS

## 2016-10-06 MED ORDER — LACTATED RINGERS IV SOLN
INTRAVENOUS | Status: DC
  Administered 2016-10-06 (×2): via INTRAVENOUS

## 2016-10-06 MED ORDER — PHENYLEPHRINE 40 MCG/ML (10ML) SYRINGE FOR IV PUSH (FOR BLOOD PRESSURE SUPPORT)
PREFILLED_SYRINGE | INTRAVENOUS | Status: AC
  Filled 2016-10-06: qty 20

## 2016-10-06 MED ORDER — KETOROLAC TROMETHAMINE 30 MG/ML IJ SOLN: 30.0000 mg | Freq: Four times a day (QID) | INTRAMUSCULAR | Status: AC | PRN

## 2016-10-06 MED ORDER — FENTANYL CITRATE (PF) 100 MCG/2ML IJ SOLN
INTRAMUSCULAR | Status: AC
  Filled 2016-10-06: qty 2

## 2016-10-06 MED ORDER — PHENYLEPHRINE HCL 10 MG/ML IJ SOLN
INTRAMUSCULAR | Status: DC | PRN
  Administered 2016-10-06 (×2): 100 ug via INTRAVENOUS

## 2016-10-06 MED ORDER — MENTHOL 3 MG MT LOZG: 1.0000 | LOZENGE | OROMUCOSAL | Status: DC | PRN

## 2016-10-06 MED ORDER — DEXAMETHASONE SODIUM PHOSPHATE 4 MG/ML IJ SOLN
INTRAMUSCULAR | Status: DC | PRN
  Administered 2016-10-06: 4 mg via INTRAVENOUS

## 2016-10-06 MED ORDER — METHYLERGONOVINE MALEATE 0.2 MG PO TABS: 0.2000 mg | ORAL_TABLET | ORAL | Status: DC | PRN

## 2016-10-06 MED ORDER — BUPIVACAINE IN DEXTROSE 0.75-8.25 % IT SOLN
INTRATHECAL | Status: DC | PRN
  Administered 2016-10-06: 1.6 mL via INTRATHECAL

## 2016-10-06 MED ORDER — FENTANYL CITRATE (PF) 100 MCG/2ML IJ SOLN: 25.0000 ug | INTRAMUSCULAR | Status: DC | PRN

## 2016-10-06 MED ORDER — PHENYLEPHRINE 8 MG IN D5W 100 ML (0.08MG/ML) PREMIX OPTIME
INJECTION | INTRAVENOUS | Status: AC
  Filled 2016-10-06: qty 100

## 2016-10-06 MED ORDER — DIBUCAINE 1 % RE OINT: 1.0000 "application " | TOPICAL_OINTMENT | RECTAL | Status: DC | PRN

## 2016-10-06 MED ORDER — SCOPOLAMINE 1 MG/3DAYS TD PT72
MEDICATED_PATCH | TRANSDERMAL | Status: AC
Start: 2016-10-06 — End: 2016-10-06
  Filled 2016-10-06: qty 1

## 2016-10-06 MED ORDER — OXYTOCIN 10 UNIT/ML IJ SOLN
INTRAVENOUS | Status: DC | PRN
  Administered 2016-10-06: 40 [IU] via INTRAVENOUS

## 2016-10-06 MED ORDER — COCONUT OIL OIL: 1.0000 "application " | TOPICAL_OIL | Status: DC | PRN

## 2016-10-06 MED ORDER — NALOXONE HCL 0.4 MG/ML IJ SOLN: 0.4000 mg | INTRAMUSCULAR | Status: DC | PRN

## 2016-10-06 MED ORDER — DIPHENHYDRAMINE HCL 25 MG PO CAPS: 25.0000 mg | ORAL_CAPSULE | Freq: Four times a day (QID) | ORAL | Status: DC | PRN

## 2016-10-06 MED ORDER — SOD CITRATE-CITRIC ACID 500-334 MG/5ML PO SOLN
ORAL | Status: AC
  Administered 2016-10-06: 30 mL
  Filled 2016-10-06: qty 15

## 2016-10-06 MED ORDER — DIPHENHYDRAMINE HCL 50 MG/ML IJ SOLN: 12.5000 mg | INTRAMUSCULAR | Status: DC | PRN

## 2016-10-06 MED ORDER — OXYTOCIN 40 UNITS IN LACTATED RINGERS INFUSION - SIMPLE MED: 2.5000 [IU]/h | INTRAVENOUS | Status: AC

## 2016-10-06 MED ORDER — MORPHINE SULFATE-NACL 0.5-0.9 MG/ML-% IV SOSY
PREFILLED_SYRINGE | INTRAVENOUS | Status: DC | PRN
  Administered 2016-10-06: .2 mg via INTRATHECAL

## 2016-10-06 MED ORDER — LACTATED RINGERS IV BOLUS (SEPSIS)
500.0000 mL | Freq: Once | INTRAVENOUS | Status: AC
  Administered 2016-10-06: 500 mL via INTRAVENOUS

## 2016-10-06 MED ORDER — NALBUPHINE HCL 10 MG/ML IJ SOLN: 5.0000 mg | INTRAMUSCULAR | Status: DC | PRN

## 2016-10-06 MED ORDER — SCOPOLAMINE 1 MG/3DAYS TD PT72
MEDICATED_PATCH | TRANSDERMAL | Status: DC | PRN
  Administered 2016-10-06: 1 via TRANSDERMAL

## 2016-10-06 MED ORDER — ONDANSETRON HCL 4 MG/2ML IJ SOLN: 4.0000 mg | Freq: Three times a day (TID) | INTRAMUSCULAR | Status: DC | PRN

## 2016-10-06 MED ORDER — IBUPROFEN 600 MG PO TABS
600.0000 mg | ORAL_TABLET | Freq: Four times a day (QID) | ORAL | Status: DC
  Administered 2016-10-06 – 2016-10-09 (×10): 600 mg via ORAL
  Filled 2016-10-06 (×10): qty 1

## 2016-10-06 MED ORDER — SIMETHICONE 80 MG PO CHEW
80.0000 mg | CHEWABLE_TABLET | Freq: Three times a day (TID) | ORAL | Status: DC
  Administered 2016-10-06 – 2016-10-09 (×8): 80 mg via ORAL
  Filled 2016-10-06 (×9): qty 1

## 2016-10-06 MED ORDER — SODIUM CHLORIDE 0.9 % IR SOLN
Status: DC | PRN
  Administered 2016-10-06: 1000 mL

## 2016-10-06 MED ORDER — BUTORPHANOL TARTRATE 1 MG/ML IJ SOLN
1.0000 mg | Freq: Once | INTRAMUSCULAR | Status: AC
  Administered 2016-10-06: 1 mg via INTRAVENOUS

## 2016-10-06 MED ORDER — CEFAZOLIN SODIUM 10 G IJ SOLR
INTRAMUSCULAR | Status: DC | PRN
  Administered 2016-10-06: 3 g via INTRAVENOUS

## 2016-10-06 MED ORDER — ACETAMINOPHEN 325 MG PO TABS: 650.0000 mg | ORAL_TABLET | ORAL | Status: DC | PRN

## 2016-10-06 MED ORDER — MEPERIDINE HCL 25 MG/ML IJ SOLN: 6.2500 mg | INTRAMUSCULAR | Status: DC | PRN

## 2016-10-06 MED ORDER — NALBUPHINE HCL 10 MG/ML IJ SOLN: 5.0000 mg | Freq: Once | INTRAMUSCULAR | Status: DC | PRN

## 2016-10-06 MED ORDER — METHYLERGONOVINE MALEATE 0.2 MG/ML IJ SOLN: 0.2000 mg | INTRAMUSCULAR | Status: DC | PRN

## 2016-10-06 MED ORDER — LACTATED RINGERS IV SOLN
INTRAVENOUS | Status: DC
  Administered 2016-10-06: 21:00:00 via INTRAVENOUS

## 2016-10-06 MED ORDER — BUTORPHANOL TARTRATE 1 MG/ML IJ SOLN
1.0000 mg | Freq: Once | INTRAMUSCULAR | Status: AC
  Administered 2016-10-06: 1 mg via INTRAVENOUS
  Filled 2016-10-06: qty 1

## 2016-10-06 MED ORDER — PHENYLEPHRINE 8 MG IN D5W 100 ML (0.08MG/ML) PREMIX OPTIME
INJECTION | INTRAVENOUS | Status: DC | PRN
  Administered 2016-10-06: 60 ug/min via INTRAVENOUS

## 2016-10-06 MED ORDER — TETANUS-DIPHTH-ACELL PERTUSSIS 5-2.5-18.5 LF-MCG/0.5 IM SUSP: 0.5000 mL | Freq: Once | INTRAMUSCULAR | Status: DC

## 2016-10-06 MED ORDER — SENNOSIDES-DOCUSATE SODIUM 8.6-50 MG PO TABS
2.0000 | ORAL_TABLET | ORAL | Status: DC
  Administered 2016-10-06 – 2016-10-08 (×3): 2 via ORAL
  Filled 2016-10-06 (×3): qty 2

## 2016-10-06 MED ORDER — COMPLETENATE 29-1 MG PO CHEW
1.0000 | CHEWABLE_TABLET | Freq: Every day | ORAL | Status: DC
  Administered 2016-10-07 – 2016-10-08 (×2): 1 via ORAL
  Filled 2016-10-06 (×4): qty 1

## 2016-10-06 MED ORDER — ONDANSETRON HCL 4 MG/2ML IJ SOLN
INTRAMUSCULAR | Status: DC | PRN
  Administered 2016-10-06: 4 mg via INTRAVENOUS

## 2016-10-06 MED ORDER — BUTORPHANOL TARTRATE 1 MG/ML IJ SOLN
INTRAMUSCULAR | Status: AC
  Filled 2016-10-06: qty 1

## 2016-10-06 MED ORDER — DEXAMETHASONE SODIUM PHOSPHATE 10 MG/ML IJ SOLN
INTRAMUSCULAR | Status: AC
  Filled 2016-10-06: qty 1

## 2016-10-06 MED ORDER — ONDANSETRON HCL 4 MG/2ML IJ SOLN
INTRAMUSCULAR | Status: AC
  Filled 2016-10-06: qty 2

## 2016-10-06 MED ORDER — NALOXONE HCL 2 MG/2ML IJ SOSY: 1.0000 ug/kg/h | PREFILLED_SYRINGE | INTRAVENOUS | Status: DC | PRN

## 2016-10-06 MED ORDER — ZOLPIDEM TARTRATE 5 MG PO TABS: 5.0000 mg | ORAL_TABLET | Freq: Every evening | ORAL | Status: DC | PRN

## 2016-10-06 MED ORDER — TERBUTALINE SULFATE 1 MG/ML IJ SOLN
INTRAMUSCULAR | Status: AC
  Filled 2016-10-06: qty 1

## 2016-10-06 MED ORDER — SIMETHICONE 80 MG PO CHEW
80.0000 mg | CHEWABLE_TABLET | ORAL | Status: DC
  Administered 2016-10-06 – 2016-10-08 (×3): 80 mg via ORAL
  Filled 2016-10-06 (×3): qty 1

## 2016-10-06 MED ORDER — KETOROLAC TROMETHAMINE 30 MG/ML IJ SOLN
INTRAMUSCULAR | Status: AC
  Administered 2016-10-06: 30 mg via INTRAMUSCULAR
  Filled 2016-10-06: qty 1

## 2016-10-06 MED ORDER — DIPHENHYDRAMINE HCL 25 MG PO CAPS: 25.0000 mg | ORAL_CAPSULE | ORAL | Status: DC | PRN

## 2016-10-06 MED ORDER — PRENATAL MULTIVITAMIN CH: 1.0000 | ORAL_TABLET | Freq: Every day | ORAL | Status: DC

## 2016-10-06 SURGICAL SUPPLY — 31 items
CHLORAPREP W/TINT 26ML (MISCELLANEOUS) ×3 IMPLANT
CLAMP CORD UMBIL (MISCELLANEOUS) IMPLANT
CLOTH BEACON ORANGE TIMEOUT ST (SAFETY) ×3 IMPLANT
DERMABOND ADHESIVE PROPEN (GAUZE/BANDAGES/DRESSINGS) ×2
DERMABOND ADVANCED .7 DNX6 (GAUZE/BANDAGES/DRESSINGS) ×1 IMPLANT
DRSG OPSITE POSTOP 4X10 (GAUZE/BANDAGES/DRESSINGS) ×3 IMPLANT
ELECT REM PT RETURN 9FT ADLT (ELECTROSURGICAL) ×3
ELECTRODE REM PT RTRN 9FT ADLT (ELECTROSURGICAL) ×1 IMPLANT
EXTRACTOR VACUUM M CUP 4 TUBE (SUCTIONS) IMPLANT
EXTRACTOR VACUUM M CUP 4' TUBE (SUCTIONS)
GLOVE BIOGEL PI IND STRL 7.0 (GLOVE) ×3 IMPLANT
GLOVE BIOGEL PI INDICATOR 7.0 (GLOVE) ×6
GLOVE ECLIPSE 7.0 STRL STRAW (GLOVE) ×3 IMPLANT
GOWN STRL REUS W/TWL LRG LVL3 (GOWN DISPOSABLE) ×6 IMPLANT
KIT ABG SYR 3ML LUER SLIP (SYRINGE) IMPLANT
NEEDLE HYPO 22GX1.5 SAFETY (NEEDLE) ×3 IMPLANT
NEEDLE HYPO 25X5/8 SAFETYGLIDE (NEEDLE) ×3 IMPLANT
NS IRRIG 1000ML POUR BTL (IV SOLUTION) ×3 IMPLANT
PACK C SECTION WH (CUSTOM PROCEDURE TRAY) ×3 IMPLANT
PAD ABD 7.5X8 STRL (GAUZE/BANDAGES/DRESSINGS) ×3 IMPLANT
PAD OB MATERNITY 4.3X12.25 (PERSONAL CARE ITEMS) ×3 IMPLANT
PENCIL SMOKE EVAC W/HOLSTER (ELECTROSURGICAL) ×3 IMPLANT
RTRCTR C-SECT PINK 25CM LRG (MISCELLANEOUS) IMPLANT
SUT PDS AB 0 CTX 36 PDP370T (SUTURE) IMPLANT
SUT PLAIN 2 0 XLH (SUTURE) IMPLANT
SUT VIC AB 0 CTX 36 (SUTURE) ×6
SUT VIC AB 0 CTX36XBRD ANBCTRL (SUTURE) ×2 IMPLANT
SUT VIC AB 4-0 KS 27 (SUTURE) ×6 IMPLANT
SYR CONTROL 10ML LL (SYRINGE) ×3 IMPLANT
TOWEL OR 17X24 6PK STRL BLUE (TOWEL DISPOSABLE) ×3 IMPLANT
TRAY FOLEY CATH SILVER 14FR (SET/KITS/TRAYS/PACK) ×3 IMPLANT

## 2016-10-06 NOTE — Progress Notes (Signed)
FACULTY PRACTICE ANTEPARTUM(COMPREHENSIVE) NOTE  Brittany [name] is a 25 y.o. G1P0000 at 6477w4d  who is admitted for rupture of membranes, breech.   Fetal presentation is breech. Length of Stay:  14  Days  Subjective: Pt having some slight increase in pressure. EFM: no labor Patient reports the fetal movement as active. Patient reports uterine contraction  activity as none, occasional . Patient reports  vaginal bleeding as none. Patient describes fluid per vagina as clear  Vitals:  Blood pressure 121/88, pulse 86, temperature 98.3 F (36.8 C), temperature source Oral, resp. rate 17, height 5\' 3"  (1.6 m), weight 120.2 kg (265 lb 1.6 oz), last menstrual period 02/05/2016, SpO2 100 %. Physical Examination:  General appearance - alert, well appearing, and in no distress, oriented to person, place, and time and overweight Heart - normal rate and regular rhythm Abdomen - soft, nontender, nondistended Fundal Height:  size equals dates Cervical Exam: Not evaluated. and found to be / Extremities: extremities normal, atraumatic, no cyanosis or edema and Homans sign is negative, no sign of DVT with DTRs 2+ bilaterally Membranes:ruptured  Fetal Monitoring:  Baseline: 135 bpm and Variability: Fair (1-6 bpm)  Labs:  Results for orders placed or performed during the hospital encounter of 09/22/16 (from the past 24 hour(s))  [facility] and screen [facility]   Collection Time: 10/06/16  7:48 AM  Result Value Ref Range   ABO/RH(D) AB POS    Antibody Screen PENDING    Sample Expiration 10/09/2016     Imaging Studies:     Currently EPIC will not allow sonographic studies to automatically populate into notes.  In the meantime, copy and paste results into note or free text.  Medications:  Scheduled . docusate sodium  100 mg Oral Daily  . pantoprazole  40 mg Oral Daily  . prenatal multivitamin  1 tablet Oral Q1200  . sodium chloride flush  3 mL Intravenous Q12H  . valACYclovir   1,000 mg Oral Daily   I have reviewed the patient's current medications.  ASSESSMENT: Patient Active Problem List   Diagnosis Date Noted  . Gestational hypertension 09/25/2016  . Preterm premature rupture of membranes (PPROM) with unknown onset of labor 09/22/2016  . UTI (urinary tract infection) during pregnancy 06/04/2016  . Supervision of normal first pregnancy 04/15/2016  . 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 Obesity     PLAN: Cesarean section scheduled for Wednesday, Dr Macon LargeAnyanwu.  Tilda [name],Brittany Aylesworth V 10/06/2016,8:52 AM    [name], female   DOB: 1991-10-04, 25 y.o.   MRN: [id number]

## 2016-10-06 NOTE — Anesthesia Procedure Notes (Signed)
Spinal  Patient location during procedure: OR Start time: 10/06/2016 12:21 PM End time: 10/06/2016 12:22 PM Staffing Anesthesiologist: Cecile HearingURK, STEPHEN EDWARD Performed: anesthesiologist  Preanesthetic Checklist Completed: patient identified, surgical consent, pre-op evaluation, timeout performed, IV checked, risks and benefits discussed and monitors and equipment checked Spinal Block Patient position: sitting Prep: site prepped and draped and DuraPrep Patient monitoring: continuous pulse ox and blood pressure Approach: midline Location: L3-4 Injection technique: single-shot Needle Needle type: Pencan  Needle gauge: 25 G Needle length: 9 cm Assessment Sensory level: T4 Additional Notes Functioning IV was confirmed and monitors were applied. Sterile prep and drape, including hand hygiene, mask and sterile gloves were used. The patient was positioned and the spine was prepped. The skin was anesthetized with lidocaine.  Free flow of clear CSF was obtained prior to injecting local anesthetic into the CSF.  The spinal needle aspirated freely following injection.  The needle was carefully withdrawn.  The patient tolerated the procedure well. Consent was obtained prior to procedure with all questions answered and concerns addressed. Risks including but not limited to bleeding, infection, nerve damage, paralysis, failed block, inadequate analgesia, allergic reaction, high spinal, itching and headache were discussed and the patient wished to proceed.   Arrie AranStephen Turk, MD

## 2016-10-06 NOTE — Lactation Note (Signed)
This note was copied from a baby's chart. Lactation Consultation Note Initial visit at 8 hours of age.  Mom reports planning to formula feed, but she has requested a DEBP from insurance.  Mom not states due to premature baby in NICU (3244w4d) mom will plan to pump and bottle feed baby.  LC attempted hand expression for a few minutes and no colostrum is visible at this time.  LC encouraged mom to work on hand expression again after using DEBP. DEBP set up with cleaning and storage guidelines discussed. Mom pumping on initiate phase then hand expressing and encouraged 8X/24 hours.   LC gave mom NICU booklet on providing breastmilk for her baby in the NICU.  Mom was also given # stickers to place on breastmilk that she expresses for baby.   Mom denies further concerns at this time and [facility].       Patient Name: Brittany [name]: 10/06/2016 Reason for consult: Initial assessment   Maternal Data Has patient been taught Hand Expression?: Yes Does the patient have breastfeeding experience prior to this delivery?: No  Feeding    LATCH Score/Interventions                      Lactation Tools Discussed/Used WIC Program: No Pump Review: Setup, frequency, and cleaning;Milk Storage Initiated by:: JS  [name] initiated:: 10/06/16   Consult Status Consult Status: Follow-up [name]: 10/07/16 Follow-up type: In-patient    Brittany [name], Brittany [name] 10/06/2016, 9:22 PM

## 2016-10-06 NOTE — Op Note (Signed)
Preoperative diagnosis:  1.  Intrauterine pregnancy at 2968w4d  weeks gestation                                         2.  PPROM x 15 days                                         3.  Labor                                        4.  Breech   Postoperative diagnosis:  Same as above  Procedure:  Primary cesarean section  Surgeon:  Lazaro ArmsLuther H Eure MD  Assistant:  none  Anesthesia: Spinal  Findings:  Pt had regular painful contractions this am and was given a dose of stadol 1 mg about 9 am. This helped minimally and her pain worsened.  I was called and examined patient just before noon and her cervix was 5/100/0 station confirmed breech.  As a result proceeded with rpimary Caesarean section as planned  Over a low transverse incision was delivered a viable female with Apgars of 6 and 8 weighing 4 lbs. 15.4 oz. Uterus, tubes and ovaries were all normal.  There were no other significant findings  Description of operation:  Patient was taken to the operating room and placed in the sitting position where she underwent a spinal anesthetic. She was then placed in the supine position with tilt to the left side. When adequate anesthetic level was obtained she was prepped and draped in usual sterile fashion and a Foley catheter was placed. A Pfannenstiel skin incision was made and carried down sharply to the rectus fascia which was scored in the midline extended laterally. The fascia was taken off the muscles both superiorly and without difficulty. The muscles were divided.  The peritoneal cavity was entered.  Bladder blade was placed, no bladder flap was created.  A low transverse hysterotomy incision was made and delivered a viable female  infant at [id number] with Apgars of 6 and 8 weighing4 lbs 15.4 oz in the frank breech position.  Cord pH was obtained and was 7.23. The uterus was exteriorized. It was closed in 2 layers, the first being a running interlocking layer and the second being an imbricating layer using 0  monocryl on a CTX needle. There was good resulting hemostasis. The uterus tubes and ovaries were all normal. Peritoneal cavity was irrigated vigorously. The muscles and peritoneum were reapproximated loosely. The fascia was closed using 0 Vicryl in running fashion. Subcutaneous tissue was made hemostatic and irrigated. The skin was closed using 4-0 Vicryl on a Keith needle in a subcuticular fashion.  Dermabond was placed for additional wound integrity and to serve as a barrier. Blood loss for the procedure was 500 cc. The patient received a gram of Ancef prophylactically. The patient was taken to the recovery room in good stable condition with all counts being correct x3.  EBL 500 cc  EURE,LUTHER H 10/06/2016 2:09 PM

## 2016-10-06 NOTE — Transfer of Care (Signed)
Immediate Anesthesia Transfer of Care Note  Patient: Brittany [name]  Procedure(s) Performed: Procedure(s): CESAREAN SECTION (N/A)  Patient Location: PACU  Anesthesia Type:Spinal  Level of Consciousness: awake, alert  and oriented  Airway & Oxygen Therapy: Patient Spontanous Breathing  Post-op Assessment: Report given to RN and Post -op Vital signs reviewed and stable  Post vital signs: Reviewed and stable  Last Vitals:  Vitals:   10/06/16 1338 10/06/16 1339  BP:    Pulse: 81 81  Resp: 16 17  Temp:      Last Pain:  Vitals:   10/06/16 1005  TempSrc:   PainSc: 10-Worst pain ever      Patients Stated Pain Goal: 2 (10/06/16 0020)  Complications: No apparent anesthesia complications

## 2016-10-06 NOTE — Anesthesia Preprocedure Evaluation (Signed)
Anesthesia Evaluation  Patient identified by MRN, date of birth, ID band Patient awake    Reviewed: Allergy & Precautions, NPO status , Patient's Chart, lab work & pertinent test results  Airway Mallampati: II  TM Distance: >3 FB Neck ROM: Full    Dental  (+) Teeth Intact, Dental Advisory Given   Pulmonary neg pulmonary ROS, former smoker,    Pulmonary exam normal breath sounds clear to auscultation       Cardiovascular negative cardio ROS Normal cardiovascular exam Rhythm:Regular Rate:Normal     Neuro/Psych negative neurological ROS     GI/Hepatic negative GI ROS, Neg liver ROS,   Endo/Other  Morbid obesity  Renal/GU negative Renal ROS     Musculoskeletal negative musculoskeletal ROS (+)   Abdominal   Peds  Hematology  (+) Blood dyscrasia, anemia , Plt 391k   Anesthesia Other Findings Day of surgery medications reviewed with the patient.  Reproductive/Obstetrics (+) Pregnancy Preterm, breech                              Anesthesia Physical Anesthesia Plan  ASA: III and emergent  Anesthesia Plan: Spinal   Post-op Pain Management:    Induction:   Airway Management Planned:   Additional Equipment:   Intra-op Plan:   Post-operative Plan:   Informed Consent: I have reviewed the patients History and Physical, chart, labs and discussed the procedure including the risks, benefits and alternatives for the proposed anesthesia with the patient or authorized representative who has indicated his/her understanding and acceptance.   Dental advisory given  Plan Discussed with: CRNA, Anesthesiologist and Surgeon  Anesthesia Plan Comments: (Discussed risks and benefits of and differences between spinal and general. Discussed risks of spinal including headache, backache, failure, bleeding, infection, and nerve damage. Patient consents to spinal. Questions answered. Coagulation studies and  platelet count acceptable.  Breech position in active labor--emergency)        Anesthesia Quick Evaluation

## 2016-10-06 NOTE — Progress Notes (Signed)
Marker button given to patient and instructed to hit with the start of each cramp/abd pain.

## 2016-10-06 NOTE — Anesthesia Postprocedure Evaluation (Signed)
Anesthesia Post Note  Patient: Brittany [name]  Procedure(s) Performed: Procedure(s) (LRB): CESAREAN SECTION (N/A)  Patient location during evaluation: PACU Anesthesia Type: Spinal Level of consciousness: oriented and awake and alert Pain management: pain level controlled Vital Signs Assessment: post-procedure vital signs reviewed and stable Respiratory status: spontaneous breathing, respiratory function stable and patient connected to nasal cannula oxygen Cardiovascular status: blood pressure returned to baseline and stable Postop Assessment: no headache, no backache, spinal receding, patient able to bend at knees and no signs of nausea or vomiting Anesthetic complications: no     Last Vitals:  Vitals:   10/06/16 1353 10/06/16 1354  BP:    Pulse: 67 78  Resp: 16 18  Temp:      Last Pain:  Vitals:   10/06/16 1345  TempSrc:   PainSc: 0-No pain   Pain Goal: Patients Stated Pain Goal: 2 (10/06/16 0020)               Cecile HearingStephen Edward Turk

## 2016-10-07 LAB — CBC
HEMATOCRIT: 27.3 % — AB (ref 36.0–46.0)
HEMOGLOBIN: 9 g/dL — AB (ref 12.0–15.0)
MCH: 28.9 pg (ref 26.0–34.0)
MCHC: 33 g/dL (ref 30.0–36.0)
MCV: 87.8 fL (ref 78.0–100.0)
Platelets: 288 10*3/uL (ref 150–400)
RBC: 3.11 MIL/uL — ABNORMAL LOW (ref 3.87–5.11)
RDW: 13.9 % (ref 11.5–15.5)
WBC: 17.2 10*3/uL — AB (ref 4.0–10.5)

## 2016-10-07 MED ORDER — OXYCODONE-ACETAMINOPHEN 5-325 MG PO TABS
1.0000 | ORAL_TABLET | ORAL | Status: DC | PRN
  Administered 2016-10-07 (×2): 1 via ORAL
  Administered 2016-10-07: 2 via ORAL
  Administered 2016-10-07 – 2016-10-08 (×2): 1 via ORAL
  Administered 2016-10-08: 2 via ORAL
  Administered 2016-10-08 (×3): 1 via ORAL
  Administered 2016-10-09: 2 via ORAL
  Filled 2016-10-07: qty 1
  Filled 2016-10-07 (×2): qty 2
  Filled 2016-10-07: qty 1
  Filled 2016-10-07: qty 2
  Filled 2016-10-07: qty 1
  Filled 2016-10-07 (×2): qty 2
  Filled 2016-10-07 (×2): qty 1
  Filled 2016-10-07 (×2): qty 2
  Filled 2016-10-07: qty 1

## 2016-10-07 NOTE — Progress Notes (Signed)
CSW acknowledges NICU admission.    Patient screened out for psychosocial assessment since none of the following apply:  Psychosocial stressors documented in mother or baby's chart  Gestation less than 32 weeks  Code at delivery   Infant with anomalies  Please contact the Clinical Social Worker if specific needs arise, or by MOB's request.       

## 2016-10-07 NOTE — Progress Notes (Signed)
Subjective: Postpartum Day 1: Cesarean Delivery Patient reports incisional pain, tolerating PO, + flatus and + BM.    Objective: Vital signs in last 24 hours: Temp:  [97.6 F (36.4 C)-99.2 F (37.3 C)] 99.2 F (37.3 C) (11/27 0405) Pulse Rate:  [59-112] 86 (11/27 0405) Resp:  [13-26] 14 (11/27 0405) BP: (96-156)/(60-88) 99/66 (11/27 0405) SpO2:  [93 %-100 %] 96 % (11/27 0405)  Physical Exam:  General: alert, cooperative and no distress Lochia: appropriate Uterine Fundus: firm Incision: no significant drainage, no dehiscence, no significant erythema, clean dry intact DVT Evaluation: No evidence of DVT seen on physical exam.   Recent Labs  10/07/16 0459  HGB 9.0*  HCT 27.3*    Assessment/Plan: Status post Cesarean section. Doing well postoperatively.  Continue current care .  Brittany [name] H 10/07/2016, 6:58 AM

## 2016-10-07 NOTE — Progress Notes (Signed)
Initial visit with Brittany [name] and her family to introduce spiritual care services and offer support while her baby is in NICU.  We discussed labor and her feelings about her baby's birth and NICU stay.  She is doing well and feeling somewhat uncomfortable today.  She has good support and is eager to have her first baby, Brittany [name], come home.  Please page as further needs arise.  Maryanna ShapeAmanda M. Carley Hammedavee Lomax, M.Div. [facility] Chaplain Pager [phone] Office [phone]

## 2016-10-07 NOTE — Addendum Note (Signed)
Addendum  created 10/07/16 0739 by Junious SilkMelinda Anival Pasha, CRNA   Sign clinical note

## 2016-10-07 NOTE — Lactation Note (Signed)
This note was copied from a baby's chart. Lactation Consultation Note  Patient Name: Brittany [name] MVHQI'OToday's Date: 10/07/2016  Follow up visit made.  Mom states she is pumping every 3 hours.  She obtained a few mls this AM.  Reviewed milk coming to volume.  She does have a pump at home.  Encouraged to call with concerns/assist.   Maternal Data    Feeding    LATCH Score/Interventions                      Lactation Tools Discussed/Used     Consult Status      [name], [name] S 10/07/2016, 11:50 AM

## 2016-10-07 NOTE — Anesthesia Postprocedure Evaluation (Signed)
Anesthesia Post Note  Patient: Brittany [name]  Procedure(s) Performed: Procedure(s) (LRB): CESAREAN SECTION (N/A)  Patient location during evaluation: [facility] Unit Anesthesia Type: Spinal Level of consciousness: oriented and awake and alert Pain management: pain level controlled Vital Signs Assessment: post-procedure vital signs reviewed and stable Respiratory status: spontaneous breathing and respiratory function stable Cardiovascular status: blood pressure returned to baseline and stable Postop Assessment: no headache and no backache Anesthetic complications: no     Last Vitals:  Vitals:   10/07/16 0405 10/07/16 0732  BP: 99/66 (!) 110/57  Pulse: 86 66  Resp: 14 15  Temp: 37.3 C 37 C    Last Pain:  Vitals:   10/07/16 0732  TempSrc: Oral  PainSc:    Pain Goal: Patients Stated Pain Goal: 3 (10/06/16 1800)               Junious SilkGILBERT,Minoru Chap

## 2016-10-07 NOTE — Plan of Care (Signed)
Problem: Tissue Perfusion: Goal: Risk factors for ineffective tissue perfusion will decrease Outcome: Progressing Pt ambulating in room, risks is decreasing.  Problem: Fluid Volume: Goal: Ability to maintain a balanced intake and output will improve Outcome: Progressing IV was saline locked this am, pt is tolerating PO intake well and first void was adequate @ 600cc., will continue to monitor for second void post catheter removal.

## 2016-10-08 NOTE — Progress Notes (Signed)
Post Op Day #2  Subjective:  Brittany [name] is a 25 y.o. G1P0101 352w4d s/p pLTCS for breech presentation and SOL.  No acute events overnight.  Pt denies problems with ambulating, voiding or po intake.  She denies nausea or vomiting.  Pain is well controlled.  She has had flatus. She has not had bowel movement.  Lochia Small.  Plan for birth control is oral progesterone-only contraceptive.  Method of Feeding: Breast  Objective: BP 115/71 (BP Location: Right Arm)   Pulse 83   Temp 97.8 F (36.6 C) (Oral)   Resp 18   Ht 5\' 3"  (1.6 m)   Wt 120.2 kg (265 lb 1.6 oz)   LMP 02/05/2016   SpO2 99%   Breastfeeding? Unknown   BMI 46.96 kg/m   Physical Exam:  General: alert, cooperative and no distress Lochia:normal flow Chest: CTAB Heart: RRR no m/r/g Abdomen: +BS, soft, nontender, fundus firm below umbilicus Uterine Fundus: firm DVT Evaluation: No evidence of DVT seen on physical exam. Extremities: No LE edema   Recent Labs  10/07/16 0459  HGB 9.0*  HCT 27.3*    Assessment/Plan:  ASSESSMENT: Brittany [name] is a 25 y.o. G1P0101 1052w4d pod #2 s/p pLTCS doing well.   Plan for discharge tomorrow, Breastfeeding and Contraception OCPs  Infant is in NICU.   LOS: 16 days   Jamelle HaringHillary M Tran Arzuaga, MD PGY-2 10/08/2016, 9:16 AM

## 2016-10-08 NOTE — Lactation Note (Signed)
This note was copied from a baby's chart. Lactation Consultation Note  Patient Name: Brittany [name] Date: 10/08/2016 Reason for consult: Follow-up assessment;NICU baby  Baby 79 hours old. Mom reports that she has not been pumping often because she has had a lot of company. Enc mom to ask company to step out for the 15-20 minutes it takes to pump. MOB's family enc mom to do this as well. Mom reports that she has been collecting some drops of colostrum that she has been taking to the NICU. Enc mom to pump every 2-3 hours for a total of 8 times/24 hours--except for one 5-hour stretch for hours of sleep at night. Discussed progression of milk coming to volume and the benefits of a hospital-grade pump. Mom given paperwork for a 2-week pump rental. Mom aware of pumping rooms in the NICU, and the need to use pumping kit in NICU and with the rental.  Maternal Data    Feeding    LATCH Score/Interventions                      Lactation Tools Discussed/Used Tools: Pump Breast pump type: Double-Electric Breast Pump   Consult Status Consult Status: Follow-up Date: 10/09/16 Follow-up type: In-patient    Andres Labrum 10/08/2016, 6:17 PM

## 2016-10-09 SURGERY — Surgical Case
Anesthesia: *Unknown

## 2016-10-09 MED ORDER — NORETHINDRONE 0.35 MG PO TABS: 1.0000 | ORAL_TABLET | Freq: Every day | ORAL | 11 refills | Status: DC

## 2016-10-09 MED ORDER — OXYCODONE-ACETAMINOPHEN 5-325 MG PO TABS: 1.0000 | ORAL_TABLET | ORAL | 0 refills | Status: DC | PRN

## 2016-10-09 NOTE — Lactation Note (Signed)
This note was copied from a baby's chart. Lactation Consultation Note  Patient Name: Brittany [name] ZOXWR'UToday's Date: 10/09/2016  Reminded mom to pump 8-12 times in 24 hours.  2 week pump rental completed.  Encouraged to call with questions/concerns.   Maternal Data    Feeding    LATCH Score/Interventions                      Lactation Tools Discussed/Used     Consult Status      [name], [name] 10/09/2016, 11:00 AM

## 2016-10-09 NOTE — Discharge Summary (Signed)
OB Discharge Summary     Patient Name: Brittany [name] DOB: 1991/10/29 MRN: [id number]  Date of admission: 09/22/2016 Delivering MD: Duane LopeEURE, LUTHER H   Date of discharge: 10/09/2016  Admitting diagnosis: 32 WKS, LEAKING Intrauterine pregnancy: 1957w4d     Secondary diagnosis:  Active Problems:   Preterm premature rupture of membranes (PPROM) with unknown onset of labor   Gestational hypertension  Additional problems: preterm labor     Discharge diagnosis: Preterm Pregnancy Delivered                                                                                                Post partum procedures:none  Augmentation: none  Complications: None  Hospital course:  Sceduled C/S   25 y.o. yo G1P0101 at 3657w4d was admitted to the hospital 09/22/2016 for PSROM, breech presentationMembrane Rupture Time/Date: 3:00 PM ,09/22/2016   Patient delivered a Viable infant.10/06/2016  Details of operation can be found in separate operative note.  Pateint had an uncomplicated postpartum course.  She is ambulating, tolerating a regular diet, passing flatus, and urinating well. Patient is discharged home in stable condition on  10/09/16        Brittany [name] is a 25 y.o. female G1 @ 31.4 wks presenting for SROM at 1500. States had large gush of fluid and has continued to leak sm amts at intervals. Denies vag bleeding. States good fetal movement..  Physical exam Vitals:   10/07/16 1231 10/07/16 1904 10/08/16 0740 10/08/16 1954  BP: 117/86 (!) 113/46 115/71 127/86  Pulse: 93 86 83 94  Resp: 18 18 18 18   Temp: 98.5 F (36.9 C) 98.7 F (37.1 C) 97.8 F (36.6 C) 98.7 F (37.1 C)  TempSrc: Oral Oral Oral Oral  SpO2: 97% 99% 99% 99%  Weight:      Height:       General: alert, cooperative and no distress Lochia: appropriate Uterine Fundus: firm Incision: Dressing is clean, dry, and intact DVT Evaluation: No evidence of DVT seen on physical exam. Labs: Lab Results  Component Value Date   WBC 17.2 (H) 10/07/2016   HGB 9.0 (L) 10/07/2016   HCT 27.3 (L) 10/07/2016   MCV 87.8 10/07/2016   PLT 288 10/07/2016   CMP Latest Ref Rng & Units 09/25/2016  Glucose 65 - 99 mg/dL 85  BUN 6 - 20 mg/dL 8  Creatinine 2.130.44 - 0.861.00 mg/dL 5.780.62  Sodium 469135 - 629145 mmol/L 136  Potassium 3.5 - 5.1 mmol/L 3.5  Chloride 101 - 111 mmol/L 104  CO2 22 - 32 mmol/L 23  Calcium 8.9 - 10.3 mg/dL 5.2(W8.6(L)  Total Protein 6.5 - 8.1 g/dL 6.4(L)  Total Bilirubin 0.3 - 1.2 mg/dL 4.1(L0.2(L)  Alkaline Phos 38 - 126 U/L 82  AST 15 - 41 U/L 13(L)  ALT 14 - 54 U/L 11(L)    Discharge instruction: per After Visit Summary and "Baby and Me Booklet".  After visit meds:    Medication List    STOP taking these medications   nitrofurantoin (macrocrystal-monohydrate) 100 MG capsule Commonly known as:  MACROBID     TAKE  these medications   norethindrone 0.35 MG tablet Commonly known as:  MICRONOR,CAMILA,ERRIN Take 1 tablet (0.35 mg total) by mouth daily.   oxyCODONE-acetaminophen 5-325 MG tablet Commonly known as:  PERCOCET/ROXICET Take 1-2 tablets by mouth every 4 (four) hours as needed (pain).   prenatal vitamin w/FE, FA 29-1 MG Chew chewable tablet Chew 1 tablet by mouth daily at 12 noon.       Diet: routine diet  Activity: Advance as tolerated. Pelvic rest for 6 weeks.   Outpatient follow up:6 weeks Follow up Appt:Future Appointments Date Time Provider Department Center  10/11/2016 11:45 AM Lazaro ArmsLuther H Eure, MD [facility]   6 weeks at [facility] Postpartum contraception: Progesterone only pills  Newborn Data: Live born female  Birth Weight: 4 lb 15.4 oz (2250 g) APGAR: 3, 6  Baby Feeding: Breast Disposition:NICU   10/09/2016 Scheryl DarterARNOLD,Addylin Manke, MD

## 2016-10-09 NOTE — Progress Notes (Signed)

## 2016-10-09 NOTE — Progress Notes (Signed)
CLINICAL SOCIAL WORK MATERNAL/CHILD NOTE  Patient Details  Name: Brittany [name] MRN: [id number] Date of Birth: 30-Nov-2016  Date:  2015/11/14  Clinical Social Worker Initiating Note:  Cyrstal Leitz E. Brigitte Pulse, [place] Date/ Time Initiated:  10/09/16/1000     Child's Name:  Brittany [name]   Legal Guardian:  Other (Comment) (Parents: [name] and [name])   Need for Interpreter:  None   Date of Referral:   (No referral-NICU admission-baby experiencing seizures.)     Reason for Referral:      Referral Source:      Address:  [address] [place], [place] [address]  Phone number:  [phone]   Household Members:  Significant Other   Natural Supports (not living in the home):  Immediate Family, Extended Family (Parents report having a good support system of family who live locally.)   Professional Supports: None   Employment: Full-time   Type of Work: 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   Education:      Pensions consultant:  Multimedia programmer   Other Resources:      Cultural/Religious Considerations Which May Impact Care: None stated.  MOB's facesheet notes religion as Non-Denominational.  Strengths:  Ability to meet basic needs , Compliance with medical plan , Home prepared for child , Understanding of illness   Risk Factors/Current Problems:  None   Cognitive State:  Able to Concentrate , Alert , Linear Thinking , Goal Oriented , Insightful    Mood/Affect:  Interested , Calm , Tearful    CSW Assessment: CSW met with parents in MOB's third floor room to introduce services, offer support, and evaluate how parents are coping with baby's admission to NICU at 33.4 weeks.  CSW understands that baby in on a ventilator and having seizure like activity.  Parents were present and welcoming of CSW's visit.  MOB's aunt was with them and parents stated this was a good time  to talk and gave permission to talk openly while aunt is present.   MOB acknowledges sadness regarding baby's admission to NICU, but states that she knows "he (baby) is where he needs to be."  CSW agreed and validated feelings of sadness adding that separation from baby is necessary at this point, but very unnatural.  CSW encouraged family to focus on baby rather than his surroundings and not place expectations on when he may be discharged as there is no way to predict at this point.  Parents stated understanding.  They report feeling well informed and comfortable with care baby is receiving.  CSW asked for parents to call if they would like to schedule a Family Conference at any time.  Parents were very appreciative. Parents report having a great support system and everything they need for baby at home.  They report family live locally ([place]).  MOB plans to stay with her mother while she recovers from surgery.  MOB will have 12 weeks off from her job and FOB will have 6 weeks off.   CSW provided education regarding perinatal mood disorders and stressed the importance of speaking with a medical provider if they have concerns about their emotions at any time.  Parents were engaged and attentive.  They report no concerns at this time and feel they are coping as well as expected at this time.  MOB reports she felt well during pregnancy.  CSW encouraged parents to allow  themselves to be emotional while monitoring for concerns.  CSW gave contact information and explained ongoing support services offered by NICU CSW.  CSW Plan/Description:  Patient/Family Education , Psychosocial Support and Ongoing Assessment of Needs    Zannie Runkle Elizabeth, LCSW 10/09/2016, 4:29 PM  

## 2016-10-11 ENCOUNTER — Ambulatory Visit (INDEPENDENT_AMBULATORY_CARE_PROVIDER_SITE_OTHER): Payer: Managed Care, Other (non HMO) | Admitting: Obstetrics & Gynecology

## 2016-10-11 ENCOUNTER — Encounter: Payer: Self-pay | Admitting: Obstetrics & Gynecology

## 2016-10-11 VITALS — BP 130/100 | HR 72 | Wt 273.0 lb

## 2016-10-11 DIAGNOSIS — Z98891 History of uterine scar from previous surgery: Secondary | ICD-10-CM

## 2016-10-11 DIAGNOSIS — Z9889 Other specified postprocedural states: Secondary | ICD-10-CM | POA: Diagnosis not present

## 2016-10-11 NOTE — Progress Notes (Signed)
  HPI: Patient returns for routine postoperative follow-up having undergone primary Caesarean section on 10/06/2016.  The patient's immediate postoperative recovery has been unremarkable. Since hospital discharge the patient reports no problems swelling.   Current Outpatient Prescriptions: oxyCODONE-acetaminophen (PERCOCET/ROXICET) 5-325 MG tablet, Take 1-2 tablets by mouth every 4 (four) hours as needed (pain)., Disp: 30 tablet, Rfl: 0 prenatal vitamin w/FE, FA (NATACHEW) 29-1 MG CHEW chewable tablet, Chew 1 tablet by mouth daily at 12 noon. , Disp: , Rfl:  norethindrone (MICRONOR,CAMILA,ERRIN) 0.35 MG tablet, Take 1 tablet (0.35 mg total) by mouth daily. (Patient not taking: Reported on 10/11/2016), Disp: 1 Package, Rfl: 11  No current facility-administered medications for this visit.     Blood pressure (!) 130/100, pulse 72, weight 273 lb (123.8 kg), last menstrual period 02/05/2016, unknown if currently breastfeeding.  Physical Exam: Incision clean dry intact   Diagnostic Tests:   Pathology: normal  Impression: S/p primary Caesarean section due to PPROM breech presentation  Plan: Routine post op care  Follow up: 4  weeks  Lazaro ArmsEURE,LUTHER H, MD

## 2016-10-22 ENCOUNTER — Ambulatory Visit: Payer: Self-pay

## 2016-10-22 NOTE — Lactation Note (Signed)
This note was copied from a baby's chart. Lactation Consultation Note  Patient Name: Brittany [name] ZOXWR'UToday's Date: 10/22/2016 Reason for consult: Follow-up assessment;NICU baby Called to assist mom with latching baby for the first time.  Mom has been pumping 6-7 times in 24 hours and obtaining 30 mls each pumping.  Discussed trying to get more pumpings in during the day.  Baby awake and showing feeding cues.  Baby latched briefly using teacup hold but unable to sustain latch.  16 mm nipple shield applied and baby latched easily and nursed off and on for 10 minutes.  Milk in the shield when baby came off.  Mom pleased.  Encouraged to attempt getting baby to breast twice per day for now.  Instructed to call for concerns/assist prn.  Maternal Data    Feeding Feeding Type: Breast Fed Length of feed: 10 min  LATCH Score/Interventions Latch: Repeated attempts needed to sustain latch, nipple held in mouth throughout feeding, stimulation needed to elicit sucking reflex. Intervention(s): Adjust position;Assist with latch;Breast massage;Breast compression  Audible Swallowing: A few with stimulation  Type of Nipple: Everted at rest and after stimulation (short)  Comfort (Breast/Nipple): Soft / non-tender     Hold (Positioning): Assistance needed to correctly position infant at breast and maintain latch. Intervention(s): Breastfeeding basics reviewed;Support Pillows;Position options;Skin to skin  LATCH Score: 7  Lactation Tools Discussed/Used Tools: Nipple Shields Nipple shield size: 16   Consult Status Consult Status: PRN Follow-up type: In-patient    [name], Lesbia Ottaway S 10/22/2016, 4:34 PM

## 2016-11-08 ENCOUNTER — Ambulatory Visit (INDEPENDENT_AMBULATORY_CARE_PROVIDER_SITE_OTHER): Payer: Managed Care, Other (non HMO) | Admitting: Women's Health

## 2016-11-08 ENCOUNTER — Telehealth: Payer: Self-pay | Admitting: Women's Health

## 2016-11-08 ENCOUNTER — Encounter: Payer: Self-pay | Admitting: Women's Health

## 2016-11-08 DIAGNOSIS — Z8751 Personal history of pre-term labor: Secondary | ICD-10-CM

## 2016-11-08 DIAGNOSIS — Z8759 Personal history of other complications of pregnancy, childbirth and the puerperium: Secondary | ICD-10-CM

## 2016-11-08 DIAGNOSIS — Z98891 History of uterine scar from previous surgery: Secondary | ICD-10-CM | POA: Insufficient documentation

## 2016-11-08 MED ORDER — NORETHIN-ETH ESTRAD-FE BIPHAS 1 MG-10 MCG / 10 MCG PO TABS: 1.0000 | ORAL_TABLET | Freq: Every day | ORAL | 3 refills | Status: DC

## 2016-11-08 NOTE — Telephone Encounter (Signed)
Spoke with pt and gave her info for the discount card.  Pt wrote all info down and will contact the pharmacy again, if there are any problems let us know.

## 2016-11-08 NOTE — Telephone Encounter (Signed)
Patient called stating she LoLoestrin prescribed is $100/month. Is there anything else she could try that would be cheaper? Please advise

## 2016-11-08 NOTE — Progress Notes (Signed)
Subjective:    Brittany [name] is a 25 y.o. 551P0101 Caucasian female who presents for a postpartum visit. She is 4 weeks postpartum following a primary cesarean section, low transverse incision at 33.4 gestational weeks, after PPROM @ 31.4wks, then labored 14d later w/ breech presentation. Developed GHTN after initial admission.  Anesthesia: spinal. I have fully reviewed the prenatal and intrapartum course. Postpartum course has been uncomplicated. Baby's course has been complicated by 3wk NICU stay. Baby is feeding by breast x 3wks, now bottle d/t decreased supply- not interested in increasing supply/resuming breastfeeding. Bleeding no bleeding. Bowel function is normal. Bladder function is normal. Patient is not sexually active. Last sexual activity: prior to birth of baby. Contraception method is wants coc's. Does not smoke, no h/o HTN, DVT/PE, CVA, MI, or migraines w/ aura. Postpartum depression screening: negative. Score 5.  Last pap 03/10/15 and was neg.  The following portions of the patient's history were reviewed and updated as appropriate: allergies, current medications, past medical history, past surgical history and problem list.  Review of Systems Pertinent items are noted in HPI.   Vitals:   11/08/16 1051  BP: 118/78  Pulse: 80  Weight: 258 lb (117 kg)  Height: 5\' 3"  (1.6 m)   Patient's last menstrual period was 02/05/2016.  Objective:   General:  alert, cooperative and no distress   Breasts:  deferred, no complaints  Lungs: clear to auscultation bilaterally  Heart:  regular rate and rhythm  Abdomen: soft, nontender, c/s incision well-healed   Vulva: normal  Vagina: normal vagina  Cervix:  closed  Corpus: Well-involuted  Adnexa:  Non-palpable  Rectal Exam: No hemorrhoids        Assessment:   Postpartum exam 4 wks s/p PLTCS @ 33wks d/t PPROM/labor/breech Had brief period of GHTN while in hosptial Bottlefeeding Depression screening Contraception counseling   Plan:   Contraception: rx LoLoestrin 3pk w/ 3RF Follow up in: 3 months for physical and coc f/u, or earlier if needed Discussed 17P and baby ASA in future pregnancies  Marge DuncansBooker, [place] Terrones Randall CNM, [facility] InstituteWHNP-BC 11/08/2016 11:08 AM

## 2016-11-08 NOTE — Patient Instructions (Signed)
Oral Contraception Use Oral contraceptive pills (OCPs) are medicines taken to prevent pregnancy. OCPs work by preventing the ovaries from releasing eggs. The hormones in OCPs also cause the cervical mucus to thicken, preventing the sperm from entering the uterus. The hormones also cause the uterine lining to become thin, not allowing a fertilized egg to attach to the inside of the uterus. OCPs are highly effective when taken exactly as prescribed. However, OCPs do not prevent sexually transmitted diseases (STDs). Safe sex practices, such as using condoms along with an OCP, can help prevent STDs. Before taking OCPs, you may have a physical exam and Pap test. Your health care provider may also order blood tests if necessary. Your health care provider will make sure you are a good candidate for oral contraception. Discuss with your health care provider the possible side effects of the OCP you may be prescribed. When starting an OCP, it can take 2 to 3 months for the body to adjust to the changes in hormone levels in your body. How to take oral contraceptive pills Your health care provider may advise you on how to start taking the first cycle of OCPs. Otherwise, you can:  Start on day 1 of your menstrual period. You will not need any backup contraceptive protection with this start time.  Start on the first Sunday after your menstrual period or the day you get your prescription. In these cases, you will need to use backup contraceptive protection for the first week.  Start the pill at any time of your cycle. If you take the pill within 5 days of the start of your period, you are protected against pregnancy right away. In this case, you will not need a backup form of birth control. If you start at any other time of your menstrual cycle, you will need to use another form of birth control for 7 days. If your OCP is the type called a minipill, it will protect you from pregnancy after taking it for 2 days (48  hours).  After you have started taking OCPs:  If you forget to take 1 pill, take it as soon as you remember. Take the next pill at the regular time.  If you miss 2 or more pills, call your health care provider because different pills have different instructions for missed doses. Use backup birth control until your next menstrual period starts.  If you use a 28-day pack that contains inactive pills and you miss 1 of the last 7 pills (pills with no hormones), it will not matter. Throw away the rest of the non-hormone pills and start a new pill pack.  No matter which day you start the OCP, you will always start a new pack on that same day of the week. Have an extra pack of OCPs and a backup contraceptive method available in case you miss some pills or lose your OCP pack. Follow these instructions at home:  Do not smoke.  Always use a condom to protect against STDs. OCPs do not protect against STDs.  Use a calendar to mark your menstrual period days.  Read the information and directions that came with your OCP. Talk to your health care provider if you have questions. Contact a health care provider if:  You develop nausea and vomiting.  You have abnormal vaginal discharge or bleeding.  You develop a rash.  You miss your menstrual period.  You are losing your hair.  You need treatment for mood swings or depression.  You   get dizzy when taking the OCP.  You develop acne from taking the OCP.  You become pregnant. Get help right away if:  You develop chest pain.  You develop shortness of breath.  You have an uncontrolled or severe headache.  You develop numbness or slurred speech.  You develop visual problems.  You develop pain, redness, and swelling in the legs. This information is not intended to replace advice given to you by your health care provider. Make sure you discuss any questions you have with your health care provider. Document Released: 10/17/2011 Document  Revised: 04/04/2016 Document Reviewed: 04/18/2013 Elsevier Interactive Patient Education  2017 Elsevier Inc.  

## 2017-02-07 ENCOUNTER — Other Ambulatory Visit: Payer: Managed Care, Other (non HMO) | Admitting: Adult Health

## 2017-02-10 ENCOUNTER — Encounter: Payer: Self-pay | Admitting: Adult Health

## 2017-02-10 ENCOUNTER — Other Ambulatory Visit (HOSPITAL_COMMUNITY)
Admission: RE | Admit: 2017-02-10 | Discharge: 2017-02-10 | Disposition: A | Discharge status: Home / Self Care | Payer: Managed Care, Other (non HMO) | Source: Ambulatory Visit | Attending: Obstetrics & Gynecology | Admitting: Obstetrics & Gynecology

## 2017-02-10 ENCOUNTER — Ambulatory Visit (INDEPENDENT_AMBULATORY_CARE_PROVIDER_SITE_OTHER): Payer: Managed Care, Other (non HMO) | Admitting: Adult Health

## 2017-02-10 VITALS — BP 120/78 | HR 66 | Ht 62.0 in | Wt 266.5 lb

## 2017-02-10 DIAGNOSIS — Z3041 Encounter for surveillance of contraceptive pills: Secondary | ICD-10-CM

## 2017-02-10 DIAGNOSIS — Z01419 Encounter for gynecological examination (general) (routine) without abnormal findings: Secondary | ICD-10-CM | POA: Insufficient documentation

## 2017-02-10 MED ORDER — NORETHIN-ETH ESTRAD-FE BIPHAS 1 MG-10 MCG / 10 MCG PO TABS: 1.0000 | ORAL_TABLET | Freq: Every day | ORAL | 4 refills | Status: DC

## 2017-02-10 NOTE — Progress Notes (Signed)
[name], female   DOB: 10-11-1991, 26 y.o.   MRN: [id number] History of Present Illness: Brittany [name] is a 26 year old white female, in for a well woman gyn exam and pap.    Current Medications, Allergies, Past Medical History, Past Surgical History, Family History and Social History were reviewed in [employer] record.     Review of Systems: Patient denies any headaches, hearing loss, fatigue, blurred vision, shortness of breath, chest pain, abdominal pain, problems with bowel movements, urination, or intercourse. No joint pain or mood swings.Some mid pack spotting on lo loestrin.    Physical Exam:BP 120/78 (BP Location: Left Arm, Patient Position: Sitting, Cuff Size: Large)   Pulse 66   Ht  (1.575 m)   Wt 266 lb 8 oz (120.9 kg)   LMP 01/14/2017 (Approximate)   Breastfeeding? No   BMI 48.74 kg/m  General:  Well developed, well nourished, no acute distress Skin:  Warm and dry Neck:  Midline trachea, normal thyroid, good ROM, no lymphadenopathy Lungs; Clear to auscultation bilaterally Breast:  No dominant palpable mass, retraction, or nipple discharge Cardiovascular: Regular rate and rhythm Abdomen:  Soft, non tender, no hepatosplenomegaly Pelvic:  External genitalia is normal in appearance, no lesions.  The vagina is normal in appearance. Urethra has no lesions or masses. The cervix is bulbous.Pap with HPV reflex performed.  Uterus is felt to be normal size, shape, and contour.  No adnexal masses or tenderness noted.Bladder is non tender, no masses felt. Extremities/musculoskeletal:  No swelling or varicosities noted, no clubbing or cyanosis Psych:  No mood changes, alert and cooperative,seems happy PHQ 2 score 0.  Impression: 1. Encounter for gynecological examination with Papanicolaou smear of cervix   2. Encounter for surveillance of contraceptive pills     Plan: Refilled lo loestrin disp #3 packs take 1 daily with 4 refills and  discount card given Physical in 1 year, pap in 3 if normal

## 2017-02-11 ENCOUNTER — Telehealth: Payer: Self-pay | Admitting: Adult Health

## 2017-02-11 ENCOUNTER — Encounter: Payer: Self-pay | Admitting: Adult Health

## 2017-02-12 LAB — CYTOLOGY - PAP: DIAGNOSIS: NEGATIVE

## 2017-02-12 NOTE — Telephone Encounter (Signed)
LMOVM that depending on her insurance, she may only be able to get the Lo Lo for $25/month. I gave her the customer service number to call if she had any other questions.

## 2017-02-14 ENCOUNTER — Ambulatory Visit: Payer: Managed Care, Other (non HMO) | Admitting: Obstetrics & Gynecology

## 2017-02-17 ENCOUNTER — Ambulatory Visit (INDEPENDENT_AMBULATORY_CARE_PROVIDER_SITE_OTHER): Payer: Managed Care, Other (non HMO) | Admitting: Adult Health

## 2017-02-17 ENCOUNTER — Encounter: Payer: Self-pay | Admitting: Adult Health

## 2017-02-17 VITALS — BP 140/90 | HR 84 | Ht 62.0 in | Wt 266.0 lb

## 2017-02-17 DIAGNOSIS — Z6841 Body Mass Index (BMI) 40.0 and over, adult: Secondary | ICD-10-CM | POA: Diagnosis not present

## 2017-02-17 DIAGNOSIS — R3915 Urgency of urination: Secondary | ICD-10-CM

## 2017-02-17 DIAGNOSIS — Z713 Dietary counseling and surveillance: Secondary | ICD-10-CM

## 2017-02-17 LAB — POCT URINALYSIS DIPSTICK
Blood, UA: NEGATIVE
Glucose, UA: NEGATIVE
Ketones, UA: NEGATIVE
LEUKOCYTES UA: NEGATIVE
Nitrite, UA: NEGATIVE
PROTEIN UA: NEGATIVE

## 2017-02-17 MED ORDER — PHENTERMINE HCL 37.5 MG PO CAPS: 37.5000 mg | ORAL_CAPSULE | ORAL | 0 refills | Status: DC

## 2017-02-17 NOTE — Progress Notes (Signed)
Subjective:     [name], female   DOB: 12-Sep-1991, 26 y.o.   MRN: [id number]  HPI Brittany [name] is a 26 year old white female in complaining of urinary urgency on and off since Thursday.And wants to lose weight, and needs help.  Review of Systems Urinary urgency Wants to lose weight Reviewed past medical,surgical, social and family history. Reviewed medications and allergies.     Objective:   Physical Exam BP 140/90 (BP Location: Right Arm, Cuff Size: Large)   Pulse 84   Ht  (1.575 m)   Wt 266 lb (120.7 kg)   LMP 02/14/2017   Breastfeeding? No   BMI 48.65 kg/m Urine negative.Skin warm and dry. Lungs: clear to ausculation bilaterally. Cardiovascular: regular rate and rhythm.Abdomen is soft and non tender.    Assessment:     1. Urinary urgency   2. Weight loss counseling, encounter for   3. Body mass index (BMI) of 45.0-49.9 in adult [facility])       Plan:     Rx adipex 37.5 mg #30 take 1 daily, no refills Cut carbs, eat more lean meats, fresh fruit and veggies and increase water Follow up in 4 weeks for weight and BP check

## 2017-02-25 ENCOUNTER — Encounter: Payer: Self-pay | Admitting: Adult Health

## 2017-02-26 ENCOUNTER — Other Ambulatory Visit: Payer: Self-pay | Admitting: Women's Health

## 2017-03-17 ENCOUNTER — Ambulatory Visit (INDEPENDENT_AMBULATORY_CARE_PROVIDER_SITE_OTHER): Payer: Managed Care, Other (non HMO) | Admitting: Adult Health

## 2017-03-17 ENCOUNTER — Encounter: Payer: Self-pay | Admitting: Adult Health

## 2017-03-17 VITALS — BP 118/84 | HR 64 | Ht 62.0 in | Wt 262.0 lb

## 2017-03-17 DIAGNOSIS — Z713 Dietary counseling and surveillance: Secondary | ICD-10-CM | POA: Diagnosis not present

## 2017-03-17 DIAGNOSIS — Z6841 Body Mass Index (BMI) 40.0 and over, adult: Secondary | ICD-10-CM | POA: Diagnosis not present

## 2017-03-17 MED ORDER — PHENTERMINE HCL 37.5 MG PO CAPS
37.5000 mg | ORAL_CAPSULE | ORAL | 0 refills | Status: DC
Start: 2017-03-17 — End: 2017-04-21

## 2017-03-17 NOTE — Progress Notes (Signed)
Subjective:     Patient ID: Jamie Katomily N Nin, female   DOB: [birth year], 26 y.o.   MRN: [id number]  HPI [name] is a 26 year old white female in for weight and BP check,has been on adipex for 1 month, no complaints.   Review of Systems  Patient denies any headaches, hearing loss, fatigue, blurred vision, shortness of breath, chest pain, abdominal pain, problems with bowel movements, urination, or intercourse. No joint pain or mood swings. Reviewed past medical,surgical, social and family history. Reviewed medications and allergies.     Objective:   Physical Exam BP 118/84 (BP Location: Left Arm, Patient Position: Sitting, Cuff Size: Large)   Pulse 64   Ht 5\' 2"  (1.575 m)   Wt 262 lb (118.8 kg)   BMI 47.92 kg/m  Skin warm and dry. Lungs: clear to ausculation bilaterally. Cardiovascular: regular rate and rhythm.   Encouraged for weight loss, lost 4 lbs. She wants to continue the adipex, increase water.   Assessment:     1. Weight loss counseling, encounter for   2. Body mass index (BMI) of 45.0-49.9 in adult [facility])       Plan:     Meds ordered this encounter  Medications  . phentermine 37.5 MG capsule    Sig: Take 1 capsule (37.5 mg total) by mouth every morning.    Dispense:  30 capsule    Refill:  0    Order Specific Question:   Supervising Provider    Answer:   Duane LopeEURE, LUTHER H [[id number]]  Follow up in 4 weeks for weight and BP check

## 2017-03-21 ENCOUNTER — Encounter: Payer: Self-pay | Admitting: Family Medicine

## 2017-03-21 ENCOUNTER — Ambulatory Visit (INDEPENDENT_AMBULATORY_CARE_PROVIDER_SITE_OTHER): Payer: Managed Care, Other (non HMO) | Admitting: Family Medicine

## 2017-03-21 VITALS — BP 128/80 | HR 80 | Temp 98.2°F | Resp 16 | Ht 62.0 in | Wt 259.0 lb

## 2017-03-21 DIAGNOSIS — J019 Acute sinusitis, unspecified: Secondary | ICD-10-CM

## 2017-03-21 DIAGNOSIS — B9689 Other specified bacterial agents as the cause of diseases classified elsewhere: Secondary | ICD-10-CM | POA: Diagnosis not present

## 2017-03-21 MED ORDER — AMOXICILLIN 875 MG PO TABS: 875.0000 mg | ORAL_TABLET | Freq: Two times a day (BID) | ORAL | 0 refills | Status: DC

## 2017-03-21 MED ORDER — FLUTICASONE PROPIONATE 50 MCG/ACT NA SUSP: 2.0000 | Freq: Every day | NASAL | 6 refills | Status: DC

## 2017-03-21 NOTE — Progress Notes (Signed)
   Subjective:    [name], female    DOB: 06-Aug-1991, 26 y.o.   MRN: [id number]  HPI Patient is a very pleasant 26 year old Caucasian female who developed symptoms approximately 4 weeks ago. Symptoms consist of a runny nose, postnasal drip, pressure in her maxillary and frontal sinuses. She reports a sore scratchy throat from postnasal drainage and a cough due to the postnasal drainage. She reports fevers and sinus headache. She is tried Mucinex and Sudafed for the last few weeks with no relief. Symptoms seem to be getting worse over the last week Past Medical History:  Diagnosis Date  . Contraceptive management 03/04/2013  . HSV-1 infection   . Medical history non-contributory   . Obesity   . Pregnant 03/26/2016   Past Surgical History:  Procedure Laterality Date  . adnoids  1995  . CESAREAN SECTION N/A 10/06/2016   Procedure: CESAREAN SECTION;  Surgeon: Tereso NewcomerUgonna A Anyanwu, MD;  Location: [facility];  Service: Obstetrics;  Laterality: N/A;  . WISDOM TOOTH EXTRACTION     Current Outpatient Prescriptions on File Prior to Visit  Medication Sig Dispense Refill  . Norethindrone-Ethinyl Estradiol-Fe Biphas (LO LOESTRIN FE) 1 MG-10 MCG / 10 MCG tablet Take 1 tablet by mouth daily. 3 Package 4  . phentermine 37.5 MG capsule Take 1 capsule (37.5 mg total) by mouth every morning. 30 capsule 0   No current facility-administered medications on file prior to visit.    No Known Allergies Social History   Social History  . Marital status: Single    Spouse name: N/A  . Number of children: N/A  . Years of education: N/A   Occupational History  . Not on file.   Social History Main Topics  . Smoking status: Former Smoker    Types: Cigarettes    Quit date: 01/14/2013  . Smokeless tobacco: Never Used  . Alcohol use Yes     Comment: occ  . Drug use: No  . Sexual activity: Yes    Birth control/ protection: Pill   Other Topics Concern  . Not on file   Social  History Narrative  . No narrative on file      Review of Systems  All other systems reviewed and are negative.      Objective:   Physical Exam  HENT:  Right Ear: External ear normal.  Left Ear: External ear normal.  Nose: Mucosal edema and rhinorrhea present. Right sinus exhibits frontal sinus tenderness. Left sinus exhibits frontal sinus tenderness.  Mouth/Throat: Oropharynx is clear and moist. No oropharyngeal exudate.  Neck: Neck supple.  Cardiovascular: Normal rate, regular rhythm and normal heart sounds.   No murmur heard. Pulmonary/Chest: Effort normal and breath sounds normal. No respiratory distress. She has no wheezes. She has no rales.  Lymphadenopathy:    She has no cervical adenopathy.  Vitals reviewed.         Assessment & Plan:  Acute bacterial rhinosinusitis  Begin amoxicillin 875 mg by mouth twice a day for 10 days. Add Flonase 2 sprays each nostril daily allergy season. Recheck in one week if no better or sooner if worse

## 2017-04-11 HISTORY — PX: CHOLECYSTECTOMY: SHX55

## 2017-04-14 ENCOUNTER — Ambulatory Visit: Payer: Managed Care, Other (non HMO) | Admitting: Adult Health

## 2017-04-18 ENCOUNTER — Encounter: Payer: Self-pay | Admitting: Obstetrics and Gynecology

## 2017-04-21 ENCOUNTER — Ambulatory Visit (INDEPENDENT_AMBULATORY_CARE_PROVIDER_SITE_OTHER): Payer: Managed Care, Other (non HMO) | Admitting: Adult Health

## 2017-04-21 ENCOUNTER — Encounter: Payer: Self-pay | Admitting: Adult Health

## 2017-04-21 VITALS — BP 128/82 | HR 80 | Ht 62.0 in | Wt 254.0 lb

## 2017-04-21 DIAGNOSIS — Z6841 Body Mass Index (BMI) 40.0 and over, adult: Secondary | ICD-10-CM

## 2017-04-21 DIAGNOSIS — Z713 Dietary counseling and surveillance: Secondary | ICD-10-CM | POA: Diagnosis not present

## 2017-04-21 MED ORDER — PHENTERMINE HCL 37.5 MG PO CAPS: 37.5000 mg | ORAL_CAPSULE | ORAL | 0 refills | Status: DC

## 2017-04-21 NOTE — Progress Notes (Signed)
Subjective:     [name], female   DOB: Jan 30, 1991, 26 y.o.   MRN: [id number]  HPI Brittany [name] is a 26 year old white female, married in for weight and BP check after starting adipex for weight loss assistance.   Review of Systems Patient denies any headaches, hearing loss, fatigue, blurred vision, shortness of breath, chest pain, abdominal pain, problems with bowel movements, urination, or intercourse. No joint pain or mood swings.No trouble sleeping with meds.  Reviewed past medical,surgical, social and family history. Reviewed medications and allergies.     Objective:   Physical Exam BP 128/82 (BP Location: Left Arm, Patient Position: Sitting, Cuff Size: Large)   Pulse 80   Ht 5\' 2"  (1.575 m)   Wt 254 lb (115.2 kg)   Breastfeeding? No   BMI 46.46 kg/m  Skin warm and dry. Lungs: clear to ausculation bilaterally. Cardiovascular: regular rate and rhythm.   Praised over 5 lbs weight loss.Has lost 20 lbs since December 2017.   Assessment:     1. Weight loss counseling, encounter for   2. Body mass index (BMI) of 45.0-49.9 in adult [facility](HCC)       Plan:     Meds ordered this encounter  Medications  . phentermine 37.5 MG capsule    Sig: Take 1 capsule (37.5 mg total) by mouth every morning.    Dispense:  30 capsule    Refill:  0    Order Specific Question:   Supervising Provider    Answer:   Despina HiddenEURE, LUTHER H [[id number]]  F/U in 4 weeks for weight and BP check Continue weight loss efforts, with diet changes,eat small frequent amounts, and exercise

## 2017-05-19 ENCOUNTER — Ambulatory Visit (INDEPENDENT_AMBULATORY_CARE_PROVIDER_SITE_OTHER): Payer: Managed Care, Other (non HMO) | Admitting: Adult Health

## 2017-05-19 ENCOUNTER — Other Ambulatory Visit: Payer: Self-pay | Admitting: *Deleted

## 2017-05-19 ENCOUNTER — Encounter: Payer: Self-pay | Admitting: Adult Health

## 2017-05-19 VITALS — BP 140/92 | HR 78 | Ht 62.0 in | Wt 251.0 lb

## 2017-05-19 DIAGNOSIS — Z6841 Body Mass Index (BMI) 40.0 and over, adult: Secondary | ICD-10-CM | POA: Diagnosis not present

## 2017-05-19 DIAGNOSIS — Z713 Dietary counseling and surveillance: Secondary | ICD-10-CM

## 2017-05-19 MED ORDER — PHENTERMINE HCL 37.5 MG PO CAPS: 37.5000 mg | ORAL_CAPSULE | ORAL | 0 refills | Status: DC

## 2017-05-19 NOTE — Patient Outreach (Signed)
[facility]) Care Management  05/19/2017  Brittany [name] 21-May-1991 [id number]  Subjective: Telephone call to patient's home  / mobile number, no answer, left HIPAA compliant voicemail message, and requested call back.   Objective: Per Brittany [name] iCollaborate and chart review, patient hospitalized  05/05/17 - 05/07/17 for Abdominal Pain.    Assessment: Received  [employer] Transition of care referral on 05/19/17.   Transition of care follow up pending patient contact.    Plan: RNCM will call patient for 2nd telephone outreach attempt, transition of care follow up, within 10 business days if no return call.     Brittany Navratil H. Gardiner Barefootooper RN, BSN, CCM [facility] Care [facility] Telephonic CM Phone: [phone] Fax: [phone]

## 2017-05-19 NOTE — Progress Notes (Signed)
Subjective:     [name], female   DOB: 1991-09-05, 26 y.o.   MRN: [id number]  HPI Brittany [name] is a 26 year old white female in for weight and BP check, she had GB [facility] [place] by Dr Shella Spearinghrismon. No complaints to day, has not been taking adipex as regular.   Review of Systems Patient denies any headaches, hearing loss, fatigue, blurred vision, shortness of breath, chest pain, abdominal pain, problems with bowel movements, urination, or intercourse. No joint pain or mood swings. Reviewed past medical,surgical, social and family history. Reviewed medications and allergies.     Objective:   Physical Exam BP (!) 140/92 (BP Location: Right Arm, Patient Position: Sitting, Cuff Size: Large)   Pulse 78   Ht 5\' 2"  (1.575 m)   Wt 251 lb (113.9 kg)   BMI 45.91 kg/m  Skin warm and dry.  Lungs: clear to ausculation bilaterally. Cardiovascular: regular rate and rhythm.   has lost 3 more pounds for a total of 15 since trying adipex and 22 since December. She wants to continue adipex.  Assessment:     1. Weight loss counseling, encounter for   2. Body mass index (BMI) of 45.0-49.9 in adult [facility](HCC)       Plan:     Meds ordered this encounter  Medications  . phentermine 37.5 MG capsule    Sig: Take 1 capsule (37.5 mg total) by mouth every morning.    Dispense:  30 capsule    Refill:  0    Order Specific Question:   Supervising Provider    Answer:   Duane LopeEURE, LUTHER H [[id number]]  Follow up in 4 weeks

## 2017-05-20 ENCOUNTER — Other Ambulatory Visit: Payer: Self-pay | Admitting: *Deleted

## 2017-05-20 ENCOUNTER — Encounter: Payer: Self-pay | Admitting: *Deleted

## 2017-05-20 NOTE — Patient Outreach (Addendum)
[facility]) Care Management  05/20/2017  Brittany [name] 10/14/1991 [id number]    Subjective: Received voicemail message from patient, states she is returning call, and requested call back.  Telephone call to patient's home / mobile number, no answer, left HIPAA compliant voicemail message, and requested call back. Telephone call from patient's home / mobile number, spoke with patient, and HIPAA verified.  [facility]ed [facility] [employer] Transition of care follow up, patient voiced understanding, and is in agreement to follow up.  Patient states she is doing well, has returned to work, trying to eat healthier, and has a follow up appointment with surgeon on 05/22/17.  States she went to the hospital with abdominal pain and had gallbladder removed on 05/05/17.  Patient states she does not have any education material, transition of care, care coordination, disease management, disease monitoring, transportation, community resource, or pharmacy needs at this time. States she is very appreciative of the follow up and is in agreement to receive [facility] information.     Objective: Per Darnelle Goingigna iCollaborate and chart review, patient hospitalized 05/05/17 - 05/07/17 for Abdominal Pain.    Assessment: Received [employer] Transition of care referral on 05/19/17. Transition of care follow up completed, no care management needs, and will proceed with case closure.    Plan: RNCM will send unsuccessful outreach  letter, [facility] pamph[facility], and proceed with case closure, within 10 business days if no return call.  RNCM will send patient successful outreach letter, [facility] pamphlet, and magnet. RNCM will send case closure due to follow up completed / no care management needs request to [facility]aminLaura [name].    Brittany Nordquist H. Gardiner Barefootooper RN, BSN, CCM [facility] Care [facility] Telephonic CM Phone: [phone] Fax: [phone]

## 2017-05-20 NOTE — Patient Outreach (Signed)
[facility] ([facility]) Care Management  05/20/2017  Brittany [name] 06/04/1991 [id number]   [facility] Telephone call to patient's home  / mobile number, no answer, left HIPAA compliant voicemail message, and requested call back.   Objective: Per Brittany [name] iCollaborate and chart review, patient hospitalized  05/05/17 - 05/07/17 for Abdominal Pain.    Assessment: Received  [employer] Transition of care referral on 05/19/17.   Transition of care follow up pending patient contact.    Plan: RNCM will call patient for 3rd telephone outreach attempt, transition of care follow up, within 10 business days if no return call.     Brittany Tatsch H. Gardiner Barefootooper RN, BSN, CCM [facility] Care [facility] Telephonic CM Phone: [phone] Fax: [phone]

## 2017-05-21 ENCOUNTER — Ambulatory Visit: Payer: Self-pay | Admitting: *Deleted

## 2017-06-16 ENCOUNTER — Encounter: Payer: Self-pay | Admitting: Family Medicine

## 2017-06-16 MED ORDER — VALACYCLOVIR HCL 1 G PO TABS: ORAL_TABLET | ORAL | 3 refills | Status: DC

## 2017-09-05 ENCOUNTER — Ambulatory Visit (INDEPENDENT_AMBULATORY_CARE_PROVIDER_SITE_OTHER): Payer: Managed Care, Other (non HMO) | Admitting: Family Medicine

## 2017-09-05 ENCOUNTER — Encounter: Payer: Self-pay | Admitting: Family Medicine

## 2017-09-05 VITALS — BP 138/82 | HR 88 | Temp 98.5°F | Resp 16 | Ht 62.0 in | Wt 254.0 lb

## 2017-09-05 DIAGNOSIS — R5382 Chronic fatigue, unspecified: Secondary | ICD-10-CM

## 2017-09-05 NOTE — Progress Notes (Signed)
Subjective:    [name], female    DOB: Nov 05, 1991, 26 y.o.   MRN: [id number]  HPI   Patient is a very pleasant 26 year old white female who is here today complaining of fatigue.  She states that she has no energy.  Has a 824-year-old little boy at home.  However she is sleeping through the night and feels like she is getting enough rest.  She also complains of a headache on a daily basis.  Her dad has sleep apnea.  Based on her BMI, and her neck circumference, she would be at high risk for obstructive sleep apnea possibly causing fatigue.  However she is requesting that her thyroid be checked.  She is concerned because she has noticed some general hair loss.  There are no patches of alopecia.  There is no scarring.  It appears to be following an androgenic pattern.  However she has no hirsutism.  There is no Adam's apple.  She is not yet having regular menstrual cycles however she was able to conceive.  She denies any chest pain shortness of breath or dyspnea on exertion.  Her blood pressure is marginally elevated Past Medical History:  Diagnosis Date  . Contraceptive management 03/04/2013  . HSV-1 infection   . Medical history non-contributory   . Obesity   . Pregnant 03/26/2016   Past Surgical History:  Procedure Laterality Date  . adnoids  1995  . CESAREAN SECTION N/A 10/06/2016   Procedure: CESAREAN SECTION;  Surgeon: Tereso NewcomerUgonna A Anyanwu, MD;  [facility];  Service: Obstetrics;  Laterality: N/A;  . CHOLECYSTECTOMY  04/2017  . WISDOM TOOTH EXTRACTION     Current Outpatient Prescriptions on File Prior to Visit  Medication Sig Dispense Refill  . fluticasone (FLONASE) 50 MCG/ACT nasal spray Place 2 sprays into both nostrils daily. (Patient taking differently: Place 2 sprays into both nostrils as needed. ) 16 g 6  . Norethindrone-Ethinyl Estradiol-Fe Biphas (LO LOESTRIN FE) 1 MG-10 MCG / 10 MCG tablet Take 1 tablet by mouth daily. 3 Package 4  . valACYclovir  (VALTREX) 1000 MG tablet Take two tablets by mouth every 12 hrs X 2 doses 4 tablet 3   No current facility-administered medications on file prior to visit.    No Known Allergies Social History   Social History  . Marital status: Single    Spouse name: N/A  . Number of children: N/A  . Years of education: N/A   Occupational History  . Not on file.   Social History Main Topics  . Smoking status: Former Smoker    Types: Cigarettes    Quit date: 01/14/2013  . Smokeless tobacco: Never Used  . Alcohol use Yes     Comment: occ  . Drug use: No  . Sexual activity: Yes    Birth control/ protection: Pill   Other Topics Concern  . Not on file   Social History Narrative  . No narrative on file    Review of Systems  All other systems reviewed and are negative.      Objective:   Physical Exam  Neck: No thyromegaly present.  Cardiovascular: Normal rate, regular rhythm and normal heart sounds.   Pulmonary/Chest: Effort normal and breath sounds normal. No respiratory distress. She has no wheezes. She has no rales.  Abdominal: Soft. Bowel sounds are normal.  Lymphadenopathy:    She has no cervical adenopathy.  Vitals reviewed.         Assessment & Plan:  Chronic fatigue - Plan: CBC with Differential/Platelet, TSH, COMPLETE METABOLIC PANEL WITH GFR, Vitamin B12  Patient denies postpartum depression.  I will be glad to check the patient with a CBC, TSH, CMP, and a vitamin B12.  If her labs are normal, I would strongly consider a sleep study to evaluate for obstructive sleep apnea.  Could also consider lab test to rule out a hyper androgenic state such as PCOS however I feel this is unlikely given the fact she recently conceived and had a child

## 2017-09-06 LAB — CBC WITH DIFFERENTIAL/PLATELET
BASOS ABS: 41 {cells}/uL (ref 0–200)
Basophils Relative: 0.4 %
Eosinophils Absolute: 185 cells/uL (ref 15–500)
Eosinophils Relative: 1.8 %
HEMATOCRIT: 37.6 % (ref 35.0–45.0)
HEMOGLOBIN: 12.6 g/dL (ref 11.7–15.5)
LYMPHS ABS: 3100 {cells}/uL (ref 850–3900)
MCH: 28.5 pg (ref 27.0–33.0)
MCHC: 33.5 g/dL (ref 32.0–36.0)
MCV: 85.1 fL (ref 80.0–100.0)
MPV: 10.6 fL (ref 7.5–12.5)
Monocytes Relative: 4.2 %
NEUTROS ABS: 6541 {cells}/uL (ref 1500–7800)
Neutrophils Relative %: 63.5 %
Platelets: 396 10*3/uL (ref 140–400)
RBC: 4.42 10*6/uL (ref 3.80–5.10)
RDW: 12.7 % (ref 11.0–15.0)
Total Lymphocyte: 30.1 %
WBC: 10.3 10*3/uL (ref 3.8–10.8)
WBCMIX: 433 {cells}/uL (ref 200–950)

## 2017-09-06 LAB — COMPLETE METABOLIC PANEL WITH GFR
AG Ratio: 1.2 (calc) (ref 1.0–2.5)
ALBUMIN MSPROF: 3.8 g/dL (ref 3.6–5.1)
ALT: 13 U/L (ref 6–29)
AST: 12 U/L (ref 10–30)
Alkaline phosphatase (APISO): 69 U/L (ref 33–115)
BILIRUBIN TOTAL: 0.2 mg/dL (ref 0.2–1.2)
BUN: 9 mg/dL (ref 7–25)
CO2: 24 mmol/L (ref 20–32)
CREATININE: 0.88 mg/dL (ref 0.50–1.10)
Calcium: 9.6 mg/dL (ref 8.6–10.2)
Chloride: 101 mmol/L (ref 98–110)
GFR, EST AFRICAN AMERICAN: 104 mL/min/{1.73_m2} (ref >=60)
GFR, Est Non African American: 90 mL/min/{1.73_m2} (ref >=60)
GLOBULIN: 3.2 g/dL (ref 1.9–3.7)
GLUCOSE: 117 mg/dL — AB (ref 65–99)
Potassium: 4.2 mmol/L (ref 3.5–5.3)
Sodium: 139 mmol/L (ref 135–146)
TOTAL PROTEIN: 7 g/dL (ref 6.1–8.1)

## 2017-09-06 LAB — TSH: TSH: 1.14 mIU/L

## 2017-09-06 LAB — VITAMIN B12: Vitamin B-12: 222 pg/mL (ref 200–1100)

## 2017-09-08 ENCOUNTER — Encounter: Payer: Self-pay | Admitting: Family Medicine

## 2017-09-08 ENCOUNTER — Encounter: Payer: Self-pay | Admitting: Adult Health

## 2018-02-25 ENCOUNTER — Other Ambulatory Visit: Payer: Managed Care, Other (non HMO) | Admitting: Adult Health

## 2018-03-13 ENCOUNTER — Other Ambulatory Visit: Payer: Self-pay

## 2018-03-13 ENCOUNTER — Encounter: Payer: Self-pay | Admitting: Family Medicine

## 2018-03-13 ENCOUNTER — Ambulatory Visit (INDEPENDENT_AMBULATORY_CARE_PROVIDER_SITE_OTHER): Payer: Managed Care, Other (non HMO) | Admitting: Family Medicine

## 2018-03-13 VITALS — BP 126/76 | HR 85 | Temp 98.0°F | Wt 267.4 lb

## 2018-03-13 DIAGNOSIS — R21 Rash and other nonspecific skin eruption: Secondary | ICD-10-CM | POA: Diagnosis not present

## 2018-03-13 MED ORDER — TRIAMCINOLONE ACETONIDE 0.1 % EX CREA: 1.0000 "application " | TOPICAL_CREAM | Freq: Two times a day (BID) | CUTANEOUS | 0 refills | Status: DC

## 2018-03-13 MED ORDER — PREDNISONE 10 MG (21) PO TBPK: ORAL_TABLET | ORAL | 0 refills | Status: DC

## 2018-03-13 NOTE — Patient Instructions (Addendum)
Will do oral steroid taper  - rash may be a contact dermatitis from soaps, eczema type rash, and/or may be fungal.  Continue to apply topical antifungal two times a day until resolved and then continue for one more week, two times a day.    You have been using butenafine HCL, you can try to switch to another over the counter antifungal like clotrimazole or Ketoconazole or Miconazole (twice a day)  If you do not have resolution of rash in two weeks please come get rechecked.  Or if topical meds help but do not completely resolve, we can try the oral medication, but right now would like to avoid it because it is hard on the liver.  Stop using the Avaguard at work and scrub in normally with soap. Focus on skin hydration as much as you can when not at work, with petroleum based ointments, use after bathing.  Use gloves if you can for when you do dishes etc, to avoid excess drying out of skin with soaps and detergents.

## 2018-03-13 NOTE — Progress Notes (Addendum)
[name], female    DOB: [birth year], 27 y.o.   MRN: [id number]  PCP: Donita Brooks, MD  Chief Complaint  Patient presents with  . Rash    Patient in today for a rash to bilateral hands. 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    Subjective:   DAVETTE [name] is a 27 y.o. female, presents to clinic with CC of 3 weeks of itchy, burning rash to the top of both hands.  It began with dry cracking skin that rapidly worsened, turning into raised patches on the dorsum of both hands.  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  She has not discontinued using it yet, but her symptoms seem to begin after using Avagard.  She tried over-the-counter treatments including cortisone cream, Neosporin, and Lotrimin.  The most effective was 1 week of topical antifungals, which helped it reduce in size to what it is currently.  When it first started she even applied bleach because she was concerned that it was a poison oak or poison ivy rash.  The burning and itching is moderate to severe.  There is no swelling, warmth or erythema of the skin surrounding the patches, no streaking redness up her arms, no swelling of her hands or fingers, no joint pain, decreased range of motion, numbness or tingling.  No prior similar rashes.  No history of eczema, psoriasis, psoriatic dermatitis, or autoimmune or rheumatic diseases.  Nobody else at home has any similar rashes.  She denies any known contact exposure to plants (poison oak or poison ivy).  The rash has not spread to any other part of her body.  No other new soaps, detergents, medicines.  No recent illness, colds, coughs.  No history of diabetes.  She denies any other signs or symptoms of allergic reaction, denies rash or swelling to face or lips, no wheeze, stridor, shortness of breath, sensation of throat closure.      Patient Active Problem List   Diagnosis  Date Noted  . Body mass index (BMI) of 45.0-49.9 in adult (HCC) 04/21/2017  . Weight loss counseling, encounter for 04/21/2017  . History of preterm delivery 11/08/2016  . History of cesarean delivery 11/08/2016  . History of gestational hypertension 11/08/2016  . 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 Obesity      Prior to Admission medications   Medication Sig Start Date End Date Taking? Authorizing Provider  Norethindrone-Ethinyl Estradiol-Fe Biphas (LO LOESTRIN FE) 1 MG-10 MCG / 10 MCG tablet Take 1 tablet by mouth daily. 02/10/17  Yes Cyril Mourning A, NP  fluticasone (FLONASE) 50 MCG/ACT nasal spray Place 2 sprays into both nostrils daily. Patient not taking: Reported on 03/13/2018 03/21/17   Donita Brooks, MD  valACYclovir (VALTREX) 1000 MG tablet Take two tablets by mouth every 12 hrs X 2 doses Patient not taking: Reported on 03/13/2018 06/16/17   Donita Brooks, MD     No Known Allergies   Family History  Problem Relation Age of Onset  . Cancer Maternal Grandmother        breast  . Cancer Paternal Uncle        lung  . Cancer Paternal Grandfather   . Cancer Cousin        lung, brain  . Cancer Maternal Uncle        colon     Social History   Socioeconomic  History  . Marital status: Single    Spouse name: Not on file  . Number of children: Not on file  . Years of education: Not on file  . Highest education level: Not on file  Occupational History  . Not on file  Social Needs  . Financial resource strain: Not on file  . Food insecurity:    Worry: Not on file    Inability: Not on file  . Transportation needs:    Medical: Not on file    Non-medical: Not on file  Tobacco Use  . Smoking status: Former Smoker    Types: Cigarettes    Last attempt to quit: 01/14/2013    Years since quitting: 5.1  . Smokeless tobacco: Never Used  Substance and Sexual Activity  . Alcohol use: Yes    Comment: occ  . Drug use: No  . Sexual activity: Yes    Birth control/protection:  Pill  Lifestyle  . Physical activity:    Days per week: Not on file    Minutes per session: Not on file  . Stress: Not on file  Relationships  . Social connections:    Talks on phone: Not on file    Gets together: Not on file    Attends religious service: Not on file    Active member of club or organization: Not on file    Attends meetings of clubs or organizations: Not on file    Relationship status: Not on file  . Intimate partner violence:    Fear of current or ex partner: Not on file    Emotionally abused: Not on file    Physically abused: Not on file    Forced sexual activity: Not on file  Other Topics Concern  . Not on file  Social History Narrative  . Not on file     Review of Systems  Constitutional: Negative.  Negative for activity change, appetite change, chills, diaphoresis, fatigue and fever.  HENT: Negative.  Negative for sore throat, trouble swallowing and voice change.   Eyes: Negative.   Respiratory: Negative.  Negative for cough, choking, chest tightness, shortness of breath and wheezing.   Cardiovascular: Negative.  Negative for chest pain.  Gastrointestinal: Negative for nausea and vomiting.  Endocrine: Negative.   Genitourinary: Negative.   Musculoskeletal: Negative.   Skin: Positive for rash. Negative for pallor and wound.  Allergic/Immunologic: Negative.   Neurological: Negative.  Negative for dizziness, weakness, light-headedness, numbness and headaches.  Hematological: Negative.   Psychiatric/Behavioral: Negative.   All other systems reviewed and are negative.      Objective:    Vitals:   03/13/18 1608  BP: 126/76  Pulse: 85  Temp: 98 F (36.7 C)  TempSrc: Oral  SpO2: 98%  Weight: 267 lb 6 oz (121.3 kg)      Physical Exam  Constitutional: She is oriented to person, place, and time. She appears well-developed and well-nourished. No distress.  HENT:  Head: Normocephalic and atraumatic.  Right Ear: External ear normal.  Left Ear:  External ear normal.  Nose: Nose normal.  Mouth/Throat: Oropharynx is clear and moist. No oropharyngeal exudate.  Uvula midline, no edema, no erythema, no stridor, no trismus  Eyes: Pupils are equal, round, and reactive to light. Conjunctivae are normal. Right eye exhibits no discharge. Left eye exhibits no discharge. No scleral icterus.  Neck: Normal range of motion. Neck supple. No tracheal deviation present.  Cardiovascular: Normal rate, regular rhythm, normal heart sounds and intact distal pulses. Exam  reveals no gallop and no friction rub.  No murmur heard. Pulmonary/Chest: Effort normal and breath sounds normal. No stridor. No respiratory distress. She has no wheezes. She has no rales. She exhibits no tenderness.  Abdominal: Soft. Bowel sounds are normal. She exhibits no distension. There is no tenderness. There is no rebound and no guarding.  Musculoskeletal: Normal range of motion. She exhibits no edema.  Lymphadenopathy:    She has no cervical adenopathy.  Neurological: She is alert and oriented to person, place, and time. She exhibits normal muscle tone. Coordination normal.  Skin: Skin is warm and dry. Capillary refill takes less than 2 seconds. Rash noted. She is not diaphoretic. There is erythema. No pallor.  Raise cracked patches to bilateral dorsums of hands ranging in size from 1cm to 2.5 cm in diameter, circular, no central clearing,, skin intact, no drainage, no edema or induration  Psychiatric: She has a normal mood and affect. Her behavior is normal.  Nursing note and vitals reviewed.         Assessment & Plan:      ICD-10-CM   1. Rash and nonspecific skin eruption R21     Differential for raised patchy rash to dorsum of bilateral hands: contact dermatitis from soaps or plants, eczema type rash (dyshidrotic eczema), and/or may be fungal. Because of 3 weeks of symptoms, will do a oral steroid taper for 12 days, patient is to continue the topical antifungal 2 times a  day until improved and then continue for 1 more week.  Has been using butenafine, if no change she was instructed to try other topical antifungals such as clotrimazole ketoconazole or miconazole.  Discussed importance of discontinuing use of Avagard, maintaining moisture and skin with ointments, particularly after bathing, and avoiding things like doing the dishes.  No concern right now for any secondary bacterial infection.  She has no surrounding cellulitis.  Because of insurance carrier patient was seen in a shared visit with Dr. Tanya Nones who did personally see and evaluate the patient.  He favors dyshidrotic eczema, so topical triamcinolone was added, instructed her to apply only to raise patches, use sparingly when they improve.  Follow-up in 2 weeks if not improved or if rebound dermatitis occurs  Danelle Berry, PA-C 03/13/18 4:22 PM Patient was seen and agree with dyshidrotic eczema and a trial of TAC cream in favor of antifungal cream.

## 2018-03-16 ENCOUNTER — Encounter: Payer: Self-pay | Admitting: Adult Health

## 2018-03-16 ENCOUNTER — Other Ambulatory Visit: Payer: Self-pay

## 2018-03-16 ENCOUNTER — Ambulatory Visit (INDEPENDENT_AMBULATORY_CARE_PROVIDER_SITE_OTHER): Payer: Managed Care, Other (non HMO) | Admitting: Adult Health

## 2018-03-16 VITALS — BP 128/86 | HR 80 | Ht 63.0 in | Wt 263.0 lb

## 2018-03-16 DIAGNOSIS — Z3041 Encounter for surveillance of contraceptive pills: Secondary | ICD-10-CM

## 2018-03-16 DIAGNOSIS — Z01419 Encounter for gynecological examination (general) (routine) without abnormal findings: Secondary | ICD-10-CM | POA: Diagnosis not present

## 2018-03-16 MED ORDER — NORETHIN-ETH ESTRAD-FE BIPHAS 1 MG-10 MCG / 10 MCG PO TABS: 1.0000 | ORAL_TABLET | Freq: Every day | ORAL | 4 refills | Status: DC

## 2018-03-16 NOTE — Progress Notes (Signed)
[name], female   DOB: 06/30/1991, 27 y.o.   MRN: [id number] History of Present Illness: Brittany [name] is a 27 year old white female married in for a well woman gyn exam,she had normal pap 02/10/17. PCP is Dr Tanya Nones.    Current Medications, Allergies, Past Medical History, Past Surgical History, Family History and Social History were reviewed in [employer] record.     Review of Systems: Patient denies any headaches, hearing loss, fatigue, blurred vision, shortness of breath, chest pain, abdominal pain, problems with bowel movements, urination, or intercourse. No joint pain or mood swings.   Physical Exam:BP 128/86 (BP Location: Right Arm, Patient Position: Sitting, Cuff Size: Large)   Pulse 80   Ht  (1.6 m)   Wt 263 lb (119.3 kg)   BMI 46.59 kg/m  General:  Well developed, well nourished, no acute distress Skin:  Warm and dry,has eczema in hands Neck:  Midline trachea, normal thyroid, good ROM, no lymphadenopathy Lungs; Clear to auscultation bilaterally Breast:  No dominant palpable mass, retraction, or nipple discharge Cardiovascular: Regular rate and rhythm Abdomen:  Soft, non tender, no hepatosplenomegaly Pelvic:  External genitalia is normal in appearance, no lesions.  The vagina is normal in appearance. Urethra has no lesions or masses. The cervix is bulbous.  Uterus is felt to be normal size, shape, and contour.  No adnexal masses or tenderness noted.Bladder is non tender, no masses felt. Extremities/musculoskeletal:  No swelling or varicosities noted, no clubbing or cyanosis Psych:  No mood changes, alert and cooperative,seems happy PHQ 2 score 0,  Impression: 1. Encounter for well woman exam with routine gynecological exam   2. Encounter for surveillance of contraceptive pills    .   Plan: Refilled lo loestrin take 1 daily disp # 3 packs with 4 refills Physical in 1 year Pap in 2021 Labs with PCP

## 2018-04-27 ENCOUNTER — Encounter: Payer: Self-pay | Admitting: Family Medicine

## 2018-04-27 ENCOUNTER — Ambulatory Visit: Payer: Managed Care, Other (non HMO) | Admitting: Family Medicine

## 2018-04-27 VITALS — BP 138/98 | HR 86 | Temp 98.1°F | Resp 14 | Ht 62.0 in | Wt 266.0 lb

## 2018-04-27 DIAGNOSIS — R21 Rash and other nonspecific skin eruption: Secondary | ICD-10-CM | POA: Diagnosis not present

## 2018-04-27 MED ORDER — MOMETASONE FUROATE 0.1 % EX OINT: TOPICAL_OINTMENT | Freq: Every day | CUTANEOUS | 0 refills | Status: DC

## 2018-04-27 MED ORDER — PERMETHRIN 5 % EX CREA: 1.0000 "application " | TOPICAL_CREAM | Freq: Once | CUTANEOUS | 0 refills | Status: AC

## 2018-04-27 NOTE — Progress Notes (Signed)
Subjective:    [name], female    DOB: 1991-05-07, 27 y.o.   MRN: [id number]  HPI  Patient was seen in May and was treated with antifungal cream, topical steroid cream, and oral steroids for rash.  The rash improved however it is returned.  Rash consist of small skin colored papules on her fingers as well as on the volar surface of both forearms.  She has dry cracked skin in between her fingers and on her fingers particularly on the dorsal surface of her PIP and DIP joints.  The rash is limited mainly to her distal forearms and her hands.  It itches.  She has a family history of eczema and her child.  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 Contraceptive management 03/04/2013  . HSV-1 infection   . Medical history non-contributory   . Obesity   . Pregnant 03/26/2016   Past Surgical History:  Procedure Laterality Date  . adnoids  1995  . CESAREAN SECTION N/A 10/06/2016   Procedure: CESAREAN SECTION;  Surgeon: Tereso Newcomer, MD;  [facility]: [facility];  Service: Obstetrics;  Laterality: N/A;  . CHOLECYSTECTOMY  04/2017  . WISDOM TOOTH EXTRACTION     Current Outpatient Medications on File Prior to Visit  Medication Sig Dispense Refill  . fluticasone (FLONASE) 50 MCG/ACT nasal spray Place 2 sprays into both nostrils daily. 16 g 6  . Norethindrone-Ethinyl Estradiol-Fe Biphas (LO LOESTRIN FE) 1 MG-10 MCG / 10 MCG tablet Take 1 tablet by mouth daily. Take 1 daily by mouth 3 Package 4  . predniSONE (STERAPRED UNI-PAK 21 TAB) 10 MG (21) TBPK tablet Take as prescribed (but do two paks starting with 60 mg PO x 2 days, then 50 mg PO x 2 days, etc) 42 tablet 0  . triamcinolone cream (KENALOG) 0.1 % Apply 1 application topically 2 (two) times daily. 30 g 0  . valACYclovir (VALTREX) 1000 MG tablet Take two tablets by mouth every 12 hrs X 2 doses 4 tablet 3   No current facility-administered  medications on file prior to visit.    No Known Allergies Social History   Socioeconomic History  . Marital status: Single    Spouse name: Not on file  . Number of children: Not on file  . Years of education: Not on file  . Highest education level: Not on file  Occupational History  . Not on file  Social Needs  . Financial resource strain: Not on file  . Food insecurity:    Worry: Not on file    Inability: Not on file  . Transportation needs:    Medical: Not on file    Non-medical: Not on file  Tobacco Use  . Smoking status: Former Smoker    Types: Cigarettes    Last attempt to quit: 01/14/2013    Years since quitting: 5.2  . Smokeless tobacco: Never Used  Substance and Sexual Activity  . Alcohol use: Yes    Comment: occ  . Drug use: No  . Sexual activity: Yes    Birth control/protection: Pill  Lifestyle  . Physical activity:    Days per week: Not on file    Minutes per session: Not on file  . Stress: Not on file  Relationships  . Social connections:    Talks on phone: Not on file    Gets together: Not on file  Attends religious service: Not on file    Active member of club or organization: Not on file    Attends meetings of clubs or organizations: Not on file    Relationship status: Not on file  . Intimate partner violence:    Fear of current or ex partner: Not on file    Emotionally abused: Not on file    Physically abused: Not on file    Forced sexual activity: Not on file  Other Topics Concern  . Not on file  Social History Narrative  . Not on file    Review of Systems  All other systems reviewed and are negative.      Objective:   Physical Exam  Neck: No thyromegaly present.  Cardiovascular: Normal rate, regular rhythm and normal heart sounds.  Pulmonary/Chest: Effort normal and breath sounds normal. No respiratory distress. She has no wheezes. She has no rales.  Abdominal: Soft. Bowel sounds are normal.  Lymphadenopathy:    She has no  cervical adenopathy.  Vitals reviewed.  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  Apply Elocon ointment at night and cover with cotton gloves.  Use moisturizers liberally throughout the day.  Given the appearance of the rash and the possible exposure of scabies at work, I will also treat the patient empirically one time with Elimite cream head to toe and rinsed off after 8 hours.  Reassess in 2 weeks if no better or sooner if worse

## 2018-04-28 ENCOUNTER — Encounter: Payer: Self-pay | Admitting: Family Medicine

## 2018-04-30 ENCOUNTER — Other Ambulatory Visit: Payer: Self-pay | Admitting: Family Medicine

## 2018-04-30 MED ORDER — ESCITALOPRAM OXALATE 10 MG PO TABS: 10.0000 mg | ORAL_TABLET | Freq: Every day | ORAL | 2 refills | Status: DC

## 2018-05-07 ENCOUNTER — Encounter: Payer: Self-pay | Admitting: Family Medicine

## 2018-05-08 ENCOUNTER — Telehealth: Payer: Self-pay | Admitting: Family Medicine

## 2018-05-08 NOTE — Telephone Encounter (Signed)
Patient came in to see dr pickard, was diagnosed with eczema, now has spread to her eyelids  Wants to know what can be done  [phone]

## 2018-05-26 ENCOUNTER — Telehealth: Payer: Self-pay | Admitting: Family Medicine

## 2018-05-26 NOTE — Telephone Encounter (Signed)
Pt called and wanted to know if we could change her med or if she needs to be seen for her rash. Per Dr. Hubbard HartshornPickard lov note pt ntbs if no better or spreading.  Called pt and LMOVM to schedule apt.

## 2018-05-29 ENCOUNTER — Other Ambulatory Visit: Payer: Self-pay

## 2018-05-29 ENCOUNTER — Ambulatory Visit: Payer: Managed Care, Other (non HMO) | Admitting: Family Medicine

## 2018-05-29 ENCOUNTER — Encounter: Payer: Self-pay | Admitting: Family Medicine

## 2018-05-29 VITALS — BP 132/90 | HR 80 | Temp 97.9°F | Resp 16 | Ht 63.0 in | Wt 267.0 lb

## 2018-05-29 DIAGNOSIS — L301 Dyshidrosis [pompholyx]: Secondary | ICD-10-CM

## 2018-05-29 MED ORDER — FLUOCINONIDE 0.05 % EX CREA: 1.0000 "application " | TOPICAL_CREAM | Freq: Two times a day (BID) | CUTANEOUS | 0 refills | Status: DC

## 2018-05-29 NOTE — Progress Notes (Signed)
Subjective:    [name], female    DOB: 22-Apr-1991, 27 y.o.   MRN: [id number]  HPI 04/27/18 Patient was seen in May and was treated with antifungal cream, topical steroid cream, and oral steroids for rash.  The rash improved however it is returned.  Rash consist of small skin colored papules on her fingers as well as on the volar surface of both forearms.  She has dry cracked skin in between her fingers and on her fingers particularly on the dorsal surface of her PIP and DIP joints.  The rash is limited mainly to her distal forearms and her hands.  It itches.  She has a family history of eczema and her child.  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  Apply Elocon ointment at night and cover with cotton gloves.  Use moisturizers liberally throughout the day.  Given the appearance of the rash and the possible exposure of scabies at work, I will also treat the patient empirically one time with Elimite cream head to toe and rinsed off after 8 hours.  Reassess in 2 weeks if no better or sooner if worse  05/29/18 Patient is applying the mometasone every night without improvement.  Rash is still confined to her hands and her forearms.  Rash consist of erythematous papules that are coalescing into plaques primarily on the dorsums of her fingers and of her hand.  There is no rash in the intertriginous space.  The skin is cracked and fissured on the tips of her fingers.  Try different scrub soaps at work and nothing seems to improve it.  She even tried empiric treatment for scabies without improvement Past Medical History:  Diagnosis Date  . Contraceptive management 03/04/2013  . HSV-1 infection   . Medical history non-contributory   . Obesity   . Pregnant 03/26/2016   Past Surgical History:  Procedure Laterality Date  . adnoids  1995  . CESAREAN SECTION N/A 10/06/2016   Procedure:  CESAREAN SECTION;  Surgeon: Tereso Newcomer, MD;  Location: [facility];  Service: Obstetrics;  Laterality: N/A;  . CHOLECYSTECTOMY  04/2017  . WISDOM TOOTH EXTRACTION     Current Outpatient Medications on File Prior to Visit  Medication Sig Dispense Refill  . escitalopram (LEXAPRO) 10 MG tablet Take 1 tablet (10 mg total) by mouth daily. 30 tablet 2  . fluticasone (FLONASE) 50 MCG/ACT nasal spray Place 2 sprays into both nostrils daily. 16 g 6  . mometasone (ELOCON) 0.1 % ointment Apply topically daily. 45 g 0  . Norethindrone-Ethinyl Estradiol-Fe Biphas (LO LOESTRIN FE) 1 MG-10 MCG / 10 MCG tablet Take 1 tablet by mouth daily. Take 1 daily by mouth 3 Package 4  . triamcinolone cream (KENALOG) 0.1 % Apply 1 application topically 2 (two) times daily. 30 g 0  . valACYclovir (VALTREX) 1000 MG tablet Take two tablets by mouth every 12 hrs X 2 doses 4 tablet 3   No current facility-administered medications on file prior to visit.    No Known Allergies Social History   Socioeconomic History  . Marital status: Single    Spouse name: Not on file  . Number of children: Not on file  . Years of education: Not on file  . Highest education level: Not on file  Occupational History  . Not on file  Social Needs  .  Financial resource strain: Not on file  . Food insecurity:    Worry: Not on file    Inability: Not on file  . Transportation needs:    Medical: Not on file    Non-medical: Not on file  Tobacco Use  . Smoking status: Former Smoker    Types: Cigarettes    Last attempt to quit: 01/14/2013    Years since quitting: 5.3  . Smokeless tobacco: Never Used  Substance and Sexual Activity  . Alcohol use: Yes    Comment: occ  . Drug use: No  . Sexual activity: Yes    Birth control/protection: Pill  Lifestyle  . Physical activity:    Days per week: Not on file    Minutes per session: Not on file  . Stress: Not on file  Relationships  . Social connections:    Talks on phone:  Not on file    Gets together: Not on file    Attends religious service: Not on file    Active member of club or organization: Not on file    Attends meetings of clubs or organizations: Not on file    Relationship status: Not on file  . Intimate partner violence:    Fear of current or ex partner: Not on file    Emotionally abused: Not on file    Physically abused: Not on file    Forced sexual activity: Not on file  Other Topics Concern  . Not on file  Social History Narrative  . Not on file    Review of Systems  Skin: Positive for rash.  All other systems reviewed and are negative.      Objective:   Physical Exam  Neck: No thyromegaly present.  Cardiovascular: Normal rate, regular rhythm and normal heart sounds.  Pulmonary/Chest: Effort normal and breath sounds normal. No respiratory distress. She has no wheezes. She has no rales.  Abdominal: Soft. Bowel sounds are normal.  Lymphadenopathy:    She has no cervical adenopathy.  Vitals reviewed.  Rash as described in the history of present illness       Assessment & Plan:  Dyshidrotic eczema Discontinue omeprazole.  Apply Lidex cream every morning and every afternoon to the affected hand for 10 to 14 days.  Apply Eucrisa cream to the affected hands at night.  Hopefully if the rash responds, we can discontinue Lidex and use of Eucrisa as a preventative strategy moving forward.  If no better on this regimen, I will consult dermatology.  Patient has improved regarding her depression on Lexapro.  Please see our email correspondence.  She would like to continue this medication as the depression has improved dramatically.  She denies any side effects on the Lexapro.

## 2018-06-01 ENCOUNTER — Encounter: Payer: Self-pay | Admitting: Family Medicine

## 2018-06-01 ENCOUNTER — Other Ambulatory Visit: Payer: Self-pay | Admitting: Family Medicine

## 2018-06-01 DIAGNOSIS — L301 Dyshidrosis [pompholyx]: Secondary | ICD-10-CM

## 2018-06-02 ENCOUNTER — Telehealth: Payer: Self-pay | Admitting: Family Medicine

## 2018-06-02 MED ORDER — PREDNISONE 10 MG PO TABS: 10.0000 mg | ORAL_TABLET | Freq: Every day | ORAL | 0 refills | Status: DC

## 2018-06-02 MED ORDER — HYDROXYZINE HCL 25 MG PO TABS: 25.0000 mg | ORAL_TABLET | Freq: Three times a day (TID) | ORAL | 0 refills | Status: DC | PRN

## 2018-06-02 NOTE — Telephone Encounter (Signed)
Called and spoke to pt to warn that the prednisone may inhibit the derm from seeing what she really has. She wanted to know if we could call in something for the itching like atarax? She will not take the Prednisone. Per Dr. Tanya NonesPickard ok to call out and med sent to pharm. Pt aware.

## 2018-06-02 NOTE — Telephone Encounter (Signed)
Try low dose prednisone 10 mg a day

## 2018-06-02 NOTE — Telephone Encounter (Signed)
[facility] [address]  Patients rash if worse, has appt with dermatologist on Monday  Can something be called in for the itching until then  [phone]

## 2018-06-27 IMAGING — US US MFM OB DETAIL+14 WK
1 series · 14 of 28 positions shown · non-contrast
Comparison: none

[Series 1: us mfm ob detail+14 wk · 56 acquisitions, 14 frames shown]
[im 3/56]
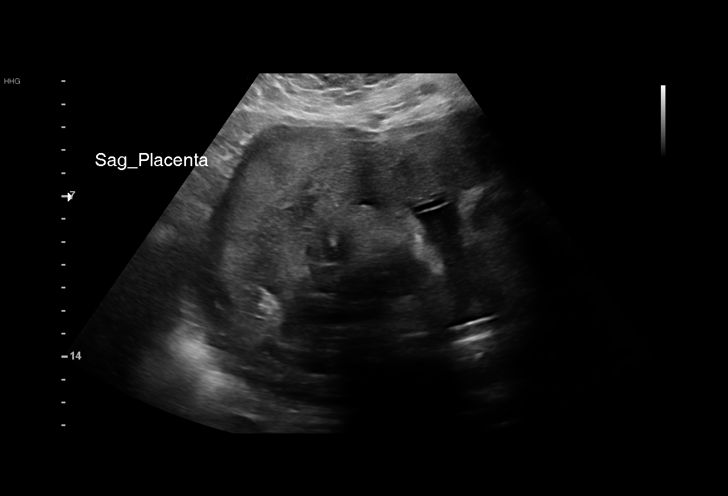
[im 7/56]
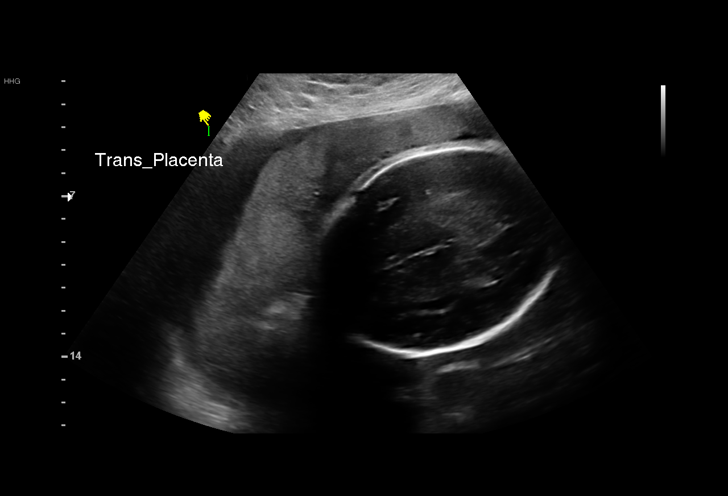
[im 11/56]
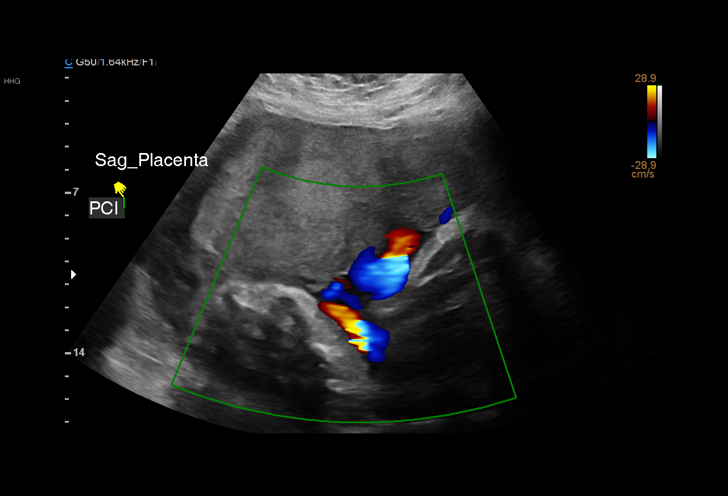
[im 15/56]
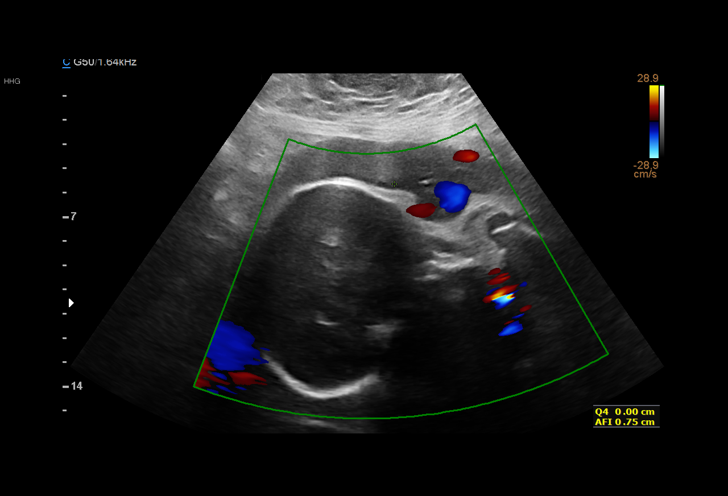
[im 19/56]
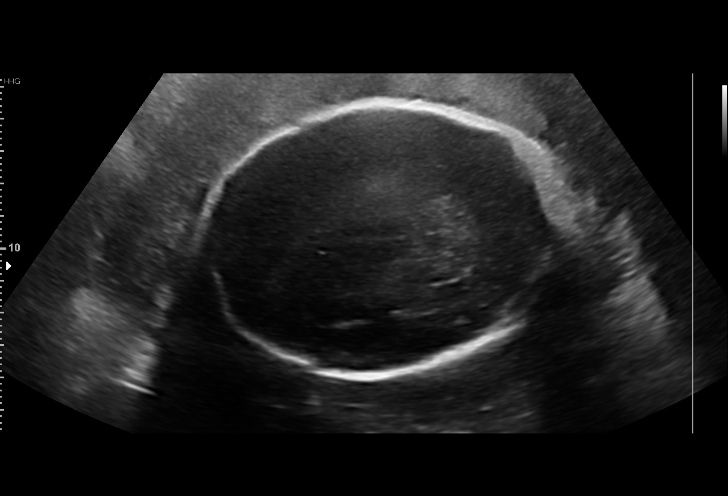
[im 23/56]
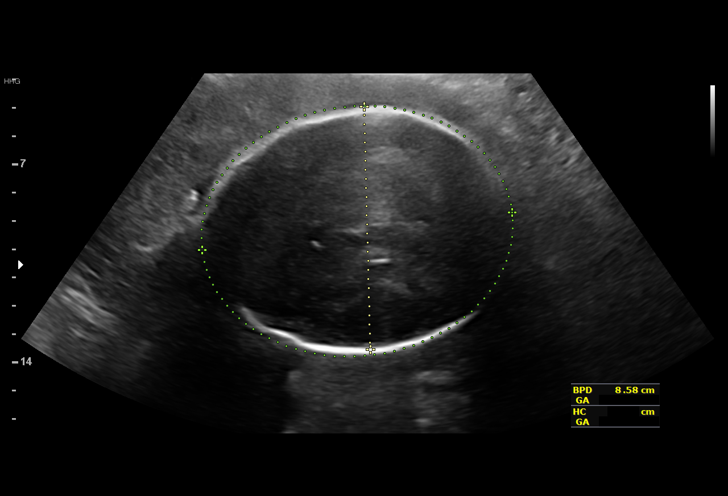
[im 27/56]
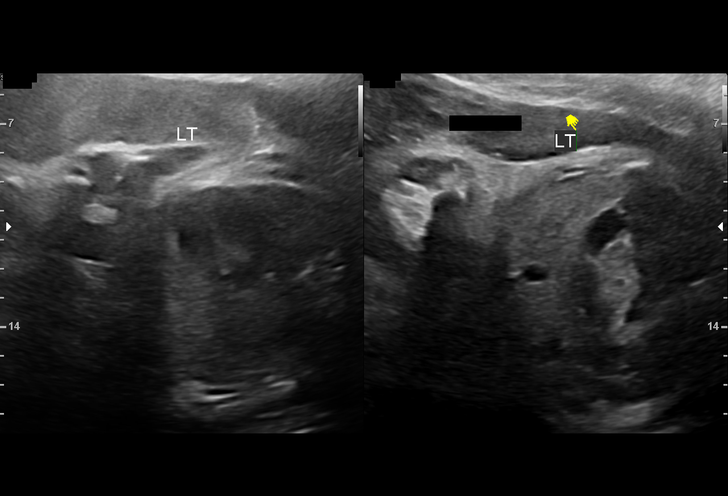
[im 31/56]
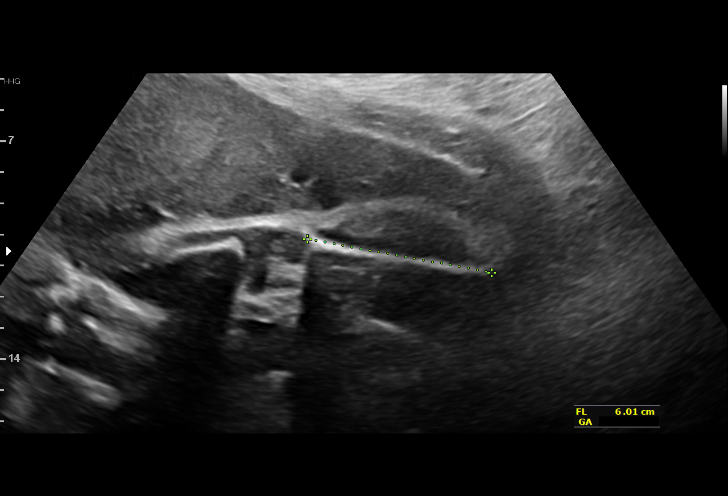
[im 35/56]
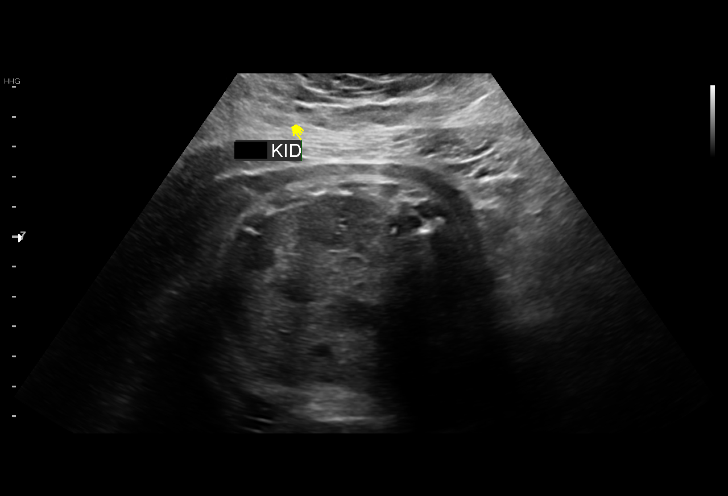
[im 39/56]
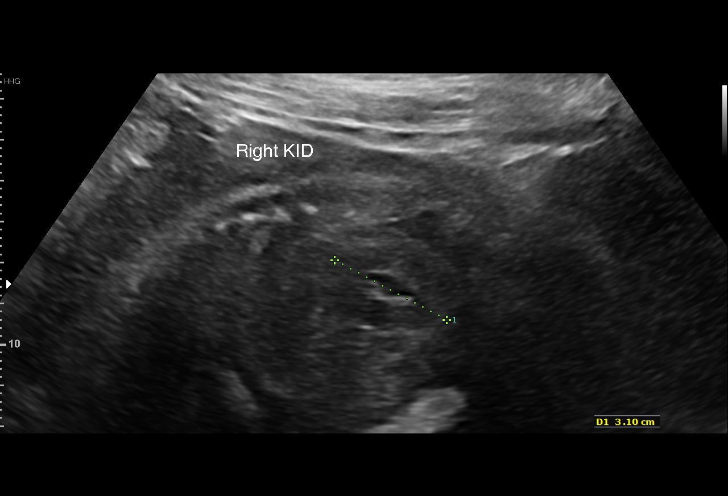
[im 43/56]
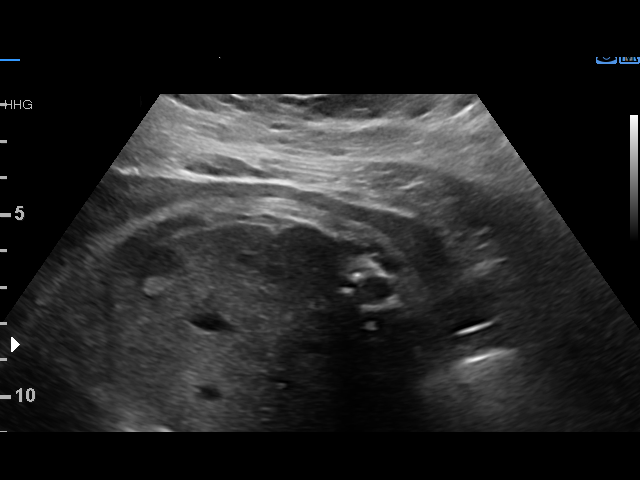
[im 47/56]
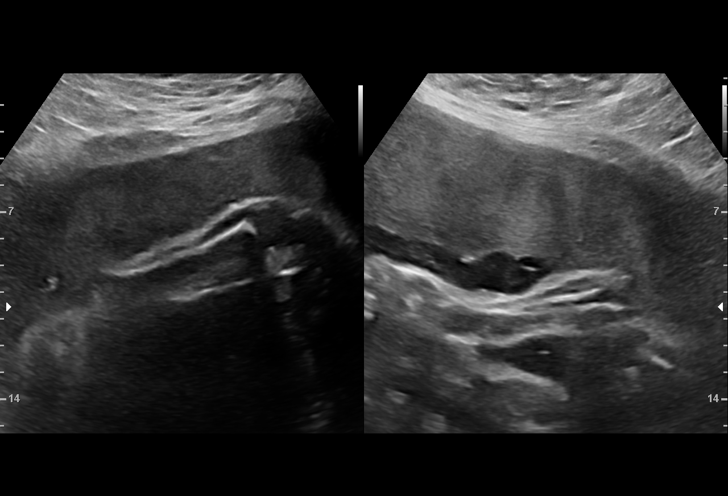
[im 51/56]
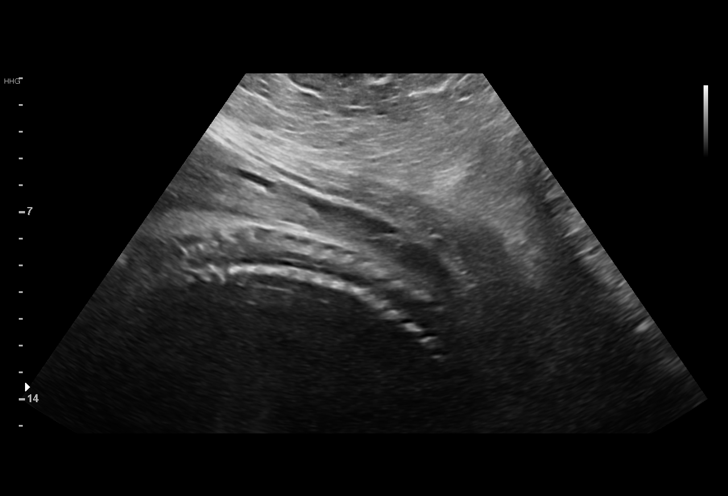
[im 56/56]
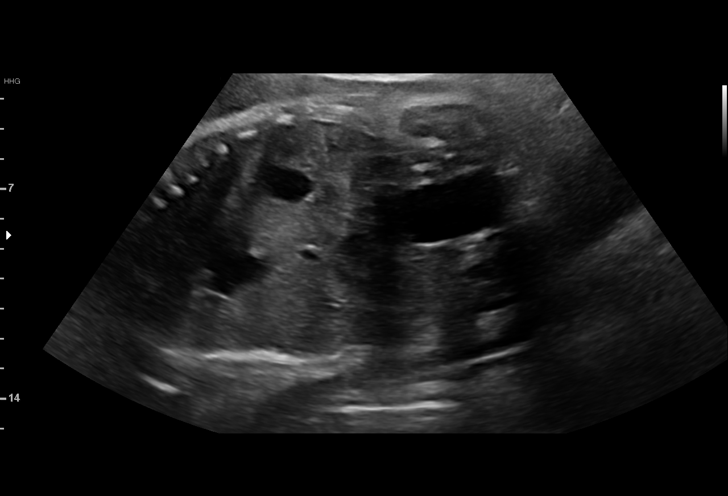

[14 of 28 positions shown; findings below may reference images not displayed]

Attending:        Melvyn Herd        Secondary Phy.:   ANTE Nursing-
Antenatal [REDACTED]9

1  JAN WITTER          [id number]      [id number]     [id number]
Indications

31 weeks gestation of pregnancy
Premature rupture of membranes - leaking
fluid (BMZ [DATE] & [DATE])
Obesity complicating pregnancy, third
trimester
Herpes simplex virus (YONGSAM1 with oral
lesions)
OB History

Blood Type:            Height:  5'3"   Weight (lb):  263      BMI:
Gravidity:    1
Fetal Evaluation

Num Of Fetuses:     1
Fetal Heart         153
Rate(bpm):
Cardiac Activity:   Observed
Presentation:       Breech
Placenta:           Anterior right lateral,above cervical os
P. Cord Insertion:  Visualized, central

Amniotic Fluid
AFI FV:      Oligohydramnios

AFI Sum(cm)     %Tile       Largest Pocket(cm)
0.75            < 3

RUQ(cm)       RLQ(cm)       LUQ(cm)        LLQ(cm)
0.75          0             0              0
Biometry

BPD:      84.1  mm     G. Age:  33w 6d         91  %    CI:        74.95   %   70 - 86
FL/HC:      19.6   %   19.1 -
HC:      308.2  mm     G. Age:  34w 3d         79  %    HC/AC:      1.02       0.96 -
AC:      302.4  mm     G. Age:  34w 2d         96  %    FL/BPD:     71.7   %   71 - 87
FL:       60.3  mm     G. Age:  31w 2d         24  %    FL/AC:      19.9   %   20 - 24
HUM:      57.8  mm     G. Age:  33w 4d         85  %

Est. FW:    9071  gm    4 lb 13 oz      80  %
Gestational Age

LMP:           33w 1d       Date:   02/05/16                 EDD:   11/11/16
U/S Today:     33w 3d                                        EDD:   11/09/16
Best:          31w 6d    Det. By:   Early Ultrasound         EDD:   11/20/16
(04/01/16)
Anatomy

Cranium:               Appears normal         Aortic Arch:            Not well visualized
Cavum:                 Appears normal         Ductal Arch:            Not well visualized
Ventricles:            Appears normal         Diaphragm:              Appears normal
Choroid Plexus:        Not well visualized    Stomach:                Appears normal, left
sided
Cerebellum:            Not well visualized    Abdomen:                Appears normal
Posterior Fossa:       Not well visualized    Abdominal Wall:         Not well visualized
Nuchal Fold:           Not applicable (>20    Cord Vessels:           Appears normal (3
wks GA)                                        vessel cord)
Face:                  Not well visualized    Kidneys:                Appear normal
Lips:                  Not well visualized    Bladder:                Appears normal
Thoracic:              Appears normal         Spine:                  Not well visualized
Heart:                 Not well visualized    Upper Extremities:      Visualized
RVOT:                  Not well visualized    Lower Extremities:      Visualized
LVOT:                  Not well visualized

Other:  Technically difficult due to low amniotic fluid.
Cervix Uterus Adnexa

Cervix
Not visualized (advanced GA >57wks)

Uterus
No abnormality visualized.

Left Ovary
Not visualized.

Right Ovary
Not visualized.

Adnexa:       No adnexal mass visualized.
Impression

Single IUP at 31w 6d
Obesity, suspected PROM
Breech presentation
Limited views of the fetal anatomy obtained due to
oligohydramnios
No gross anomalies noted
The estimated fetal weight is at the 80th %tile.
Active fetus - fetal breathing noted
Anterior placenta without previa
Oligohydramnios - single fluid pocket noted measuring < 1 cm
Recommendations

Follow-up ultrasounds as clinically indicated.

## 2018-08-05 ENCOUNTER — Other Ambulatory Visit: Payer: Self-pay | Admitting: Family Medicine

## 2018-08-20 ENCOUNTER — Encounter: Payer: Self-pay | Admitting: Physician Assistant

## 2018-08-20 ENCOUNTER — Ambulatory Visit (INDEPENDENT_AMBULATORY_CARE_PROVIDER_SITE_OTHER): Payer: Managed Care, Other (non HMO) | Admitting: Physician Assistant

## 2018-08-20 VITALS — BP 118/78 | HR 86 | Temp 98.0°F | Resp 18 | Wt 269.4 lb

## 2018-08-20 DIAGNOSIS — N390 Urinary tract infection, site not specified: Secondary | ICD-10-CM

## 2018-08-20 DIAGNOSIS — R300 Dysuria: Secondary | ICD-10-CM

## 2018-08-20 LAB — URINALYSIS, ROUTINE W REFLEX MICROSCOPIC
BILIRUBIN URINE: NEGATIVE
GLUCOSE, UA: NEGATIVE
KETONES UR: NEGATIVE
NITRITE: NEGATIVE
Protein, ur: NEGATIVE
SPECIFIC GRAVITY, URINE: 1.015 (ref 1.001–1.03)
pH: 6 (ref 5.0–8.0)

## 2018-08-20 LAB — MICROSCOPIC MESSAGE

## 2018-08-20 MED ORDER — NITROFURANTOIN MONOHYD MACRO 100 MG PO CAPS: 100.0000 mg | ORAL_CAPSULE | Freq: Two times a day (BID) | ORAL | 0 refills | Status: AC

## 2018-08-20 NOTE — Progress Notes (Signed)
    [name] MRN: [id number], DOB: 1991-01-04, 27 y.o. Date of Encounter: 08/20/2018, 2:57 PM    Chief Complaint:  Chief Complaint  Patient presents with  . urine urgency    symptoms  for 2 days   . Dysuria     HPI: 27 y.o. year old female presents with above.   Reports that she has been having some burning with urination for the last 2 days.  Also urinary urgency and frequency.  Reports that she has had no fevers or chills.  No back pain.     Home Meds:   Outpatient Medications Prior to Visit  Medication Sig Dispense Refill  . escitalopram (LEXAPRO) 10 MG tablet TAKE 1 TABLET(10 MG) BY MOUTH DAILY 90 tablet 3  . fluticasone (FLONASE) 50 MCG/ACT nasal spray Place 2 sprays into both nostrils daily. 16 g 6  . Norethindrone-Ethinyl Estradiol-Fe Biphas (LO LOESTRIN FE) 1 MG-10 MCG / 10 MCG tablet Take 1 tablet by mouth daily. Take 1 daily by mouth 3 Package 4  . valACYclovir (VALTREX) 1000 MG tablet Take two tablets by mouth every 12 hrs X 2 doses 4 tablet 3  . fluocinonide cream (LIDEX) 0.05 % Apply 1 application topically 2 (two) times daily. 30 g 0  . hydrOXYzine (ATARAX/VISTARIL) 25 MG tablet Take 1 tablet (25 mg total) by mouth 3 (three) times daily as needed. 30 tablet 0  . mometasone (ELOCON) 0.1 % ointment Apply topically daily. 45 g 0  . predniSONE (DELTASONE) 10 MG tablet Take 1 tablet (10 mg total) by mouth daily with breakfast. 5 tablet 0  . triamcinolone cream (KENALOG) 0.1 % Apply 1 application topically 2 (two) times daily. 30 g 0   No facility-administered medications prior to visit.     Allergies: No Known Allergies    Review of Systems: See HPI for pertinent ROS. All other ROS negative.    Physical Exam: Blood pressure 118/78, pulse 86, temperature 98 F (36.7 C), temperature source Oral, resp. rate 18, weight 122.2 kg, SpO2 97 %, not currently breastfeeding., Body mass index is 47.72 kg/m. General:  WF. Appears in no acute  distress. Neck: Supple. No thyromegaly. No lymphadenopathy. Lungs: Clear bilaterally to auscultation without wheezes, rales, or rhonchi. Breathing is unlabored. Heart: Regular rhythm. No murmurs, rubs, or gallops. Abdomen: Soft, non-tender, non-distended with normoactive bowel sounds. No hepatomegaly. No rebound/guarding. No obvious abdominal masses. Msk:  Strength and tone normal for age. No tenderness with percussion to costophrenic angles bilaterally. Extremities/Skin: Warm and dry. Neuro: Alert and oriented X 3. Moves all extremities spontaneously. Gait is normal. CNII-XII grossly in tact. Psych:  Responds to questions appropriately with a normal affect.      ASSESSMENT AND PLAN:  27 y.o. year old female with   1. Urinary tract infection without hematuria, site unspecified Urinalysis shows 2+ leukocyte and WBC is 10-20.  Findings consistent with UTI. She is to take antibiotic as directed.  Follow-up if symptoms do not resolve upon completion of antibiotic. - nitrofurantoin, macrocrystal-monohydrate, (MACROBID) 100 MG capsule; Take 1 capsule (100 mg total) by mouth 2 (two) times daily for 3 days.  Dispense: 6 capsule; Refill: 0  2. Dysuria - Urinalysis, Routine w reflex microscopic   Signed, [address] [place], [place], [facility] 08/20/2018 2:57 PM

## 2018-09-09 ENCOUNTER — Encounter: Payer: Self-pay | Admitting: Family Medicine

## 2018-10-13 ENCOUNTER — Ambulatory Visit (INDEPENDENT_AMBULATORY_CARE_PROVIDER_SITE_OTHER): Payer: Managed Care, Other (non HMO) | Admitting: Family Medicine

## 2018-10-13 ENCOUNTER — Encounter: Payer: Self-pay | Admitting: Family Medicine

## 2018-10-13 VITALS — BP 110/82 | HR 78 | Temp 97.7°F | Resp 14 | Ht 62.0 in | Wt 271.0 lb

## 2018-10-13 DIAGNOSIS — L301 Dyshidrosis [pompholyx]: Secondary | ICD-10-CM

## 2018-10-13 MED ORDER — PREDNISONE 20 MG PO TABS: ORAL_TABLET | ORAL | 0 refills | Status: DC

## 2018-10-13 NOTE — Progress Notes (Signed)
Subjective:    Patient ID: Brittany KatoEmily N [name], female    DOB: 1991-07-01, 27 y.o.   MRN: [id number]  Rash    04/27/18 Patient was seen in May and was treated with antifungal cream, topical steroid cream, and oral steroids for rash.  The rash improved however it is returned.  Rash consist of small skin colored papules on her fingers as well as on the volar surface of both forearms.  She has dry cracked skin in between her fingers and on her fingers particularly on the dorsal surface of her PIP and DIP joints.  The rash is limited mainly to her distal forearms and her hands.  It itches.  She has a family history of eczema and her child.  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  Apply Elocon ointment at night and cover with cotton gloves.  Use moisturizers liberally throughout the day.  Given the appearance of the rash and the possible exposure of scabies at work, I will also treat the patient empirically one time with Elimite cream head to toe and rinsed off after 8 hours.  Reassess in 2 weeks if no better or sooner if worse  05/29/18 Patient is applying the mometasone every night without improvement.  Rash is still confined to her hands and her forearms.  Rash consist of erythematous papules that are coalescing into plaques primarily on the dorsums of her fingers and of her hand.  There is no rash in the intertriginous space.  The skin is cracked and fissured on the tips of her fingers.  Try different scrub soaps at work and nothing seems to improve it.  She even tried empiric treatment for scabies without improvement.  At that time, my plan was: Discontinue omeprazole.  Apply Lidex cream every morning and every afternoon to the affected hand for 10 to 14 days.  Apply Eucrisa cream to the affected hands at night.  Hopefully if the rash responds, we can discontinue Lidex and use of Eucrisa as a preventative  strategy moving forward.  If no better on this regimen, I will consult dermatology.  Patient has improved regarding her depression on Lexapro.  Please see our email correspondence.  She would like to continue this medication as the depression has improved dramatically.  She denies any side effects on the Lexapro.  10/13/18 Patient has severe rash on both hands.  The skin proximal to the nail beds on the dorsal surface of both hands is cracked and fissured and erythematous and peeling and scaling.  There are small 1 to 3 mm papular lesions on the palms of both hands that are too numerous to count spreading onto the dorsal surfaces of both hands.  She also has an eczematous form rash of 1 to 2 mm papules with excoriation underneath both breast and on her back.  She is seen dermatology who is recommended high-dose steroid creams.  She is tried this with no improvement. Past Medical History:  Diagnosis Date  . Contraceptive management 03/04/2013  . HSV-1 infection   . Medical history non-contributory   . Obesity   . Pregnant 03/26/2016   Past Surgical History:  Procedure Laterality Date  . adnoids  1995  . CESAREAN SECTION N/A 10/06/2016   Procedure: CESAREAN SECTION;  Surgeon: Tereso NewcomerUgonna A Anyanwu, MD;  Location: [facility];  Service: Obstetrics;  Laterality: N/A;  .  CHOLECYSTECTOMY  04/2017  . WISDOM TOOTH EXTRACTION     Current Outpatient Medications on File Prior to Visit  Medication Sig Dispense Refill  . escitalopram (LEXAPRO) 10 MG tablet TAKE 1 TABLET(10 MG) BY MOUTH DAILY 90 tablet 3  . fluticasone (FLONASE) 50 MCG/ACT nasal spray Place 2 sprays into both nostrils daily. 16 g 6  . Norethindrone-Ethinyl Estradiol-Fe Biphas (LO LOESTRIN FE) 1 MG-10 MCG / 10 MCG tablet Take 1 tablet by mouth daily. Take 1 daily by mouth 3 Package 4  . valACYclovir (VALTREX) 1000 MG tablet Take two tablets by mouth every 12 hrs X 2 doses 4 tablet 3   No current facility-administered medications on file  prior to visit.    No Known Allergies Social History   Socioeconomic History  . Marital status: Single    Spouse name: Not on file  . Number of children: Not on file  . Years of education: Not on file  . Highest education level: Not on file  Occupational History  . Not on file  Social Needs  . Financial resource strain: Not on file  . Food insecurity:    Worry: Not on file    Inability: Not on file  . Transportation needs:    Medical: Not on file    Non-medical: Not on file  Tobacco Use  . Smoking status: Former Smoker    Types: Cigarettes    Last attempt to quit: 01/14/2013    Years since quitting: 5.7  . Smokeless tobacco: Never Used  Substance and Sexual Activity  . Alcohol use: Yes    Comment: occ  . Drug use: No  . Sexual activity: Yes    Birth control/protection: Pill  Lifestyle  . Physical activity:    Days per week: Not on file    Minutes per session: Not on file  . Stress: Not on file  Relationships  . Social connections:    Talks on phone: Not on file    Gets together: Not on file    Attends religious service: Not on file    Active member of club or organization: Not on file    Attends meetings of clubs or organizations: Not on file    Relationship status: Not on file  . Intimate partner violence:    Fear of current or ex partner: Not on file    Emotionally abused: Not on file    Physically abused: Not on file    Forced sexual activity: Not on file  Other Topics Concern  . Not on file  Social History Narrative  . Not on file    Review of Systems  Skin: Positive for rash.  All other systems reviewed and are negative.      Objective:   Physical Exam  Neck: No thyromegaly present.  Cardiovascular: Normal rate, regular rhythm and normal heart sounds.  Pulmonary/Chest: Effort normal and breath sounds normal. No respiratory distress. She has no wheezes. She has no rales.  Abdominal: Soft. Bowel sounds are normal.  Lymphadenopathy:    She has no  cervical adenopathy.  Vitals reviewed.  Rash as described in the history of present illness       Assessment & Plan:  Dyshidrotic eczema  I have recommended that she follow-up with her dermatologist.  Given the severity of the rash today I have recommended a prednisone taper pack to get control of this.  She is failing topical steroid.  Therefore I recommended using Eucrisa daily as a preventative versus a  topical calcineurin inhibitor such as tacrolimus daily as a preventative.  I have also recommended that she discuss this with her dermatologist.  At the present time she would like to try using the oral steroids for a week to get control the rash and then resume her daily high-dose potent steroid cream as a means to control.  She admits that she had not been using this daily prior and that she would like to give this a second chance prior to starting tacrolimus or [place]

## 2018-10-29 ENCOUNTER — Encounter: Payer: Self-pay | Admitting: Family Medicine

## 2018-10-30 ENCOUNTER — Other Ambulatory Visit: Payer: Self-pay | Admitting: Family Medicine

## 2018-10-30 ENCOUNTER — Encounter: Payer: Self-pay | Admitting: Family Medicine

## 2018-10-30 MED ORDER — CRISABOROLE 2 % EX OINT: 1.0000 "application " | TOPICAL_OINTMENT | Freq: Every day | CUTANEOUS | 2 refills | Status: DC

## 2018-11-23 ENCOUNTER — Other Ambulatory Visit: Payer: Self-pay | Admitting: Family Medicine

## 2018-11-23 ENCOUNTER — Encounter: Payer: Self-pay | Admitting: Family Medicine

## 2018-11-23 DIAGNOSIS — L301 Dyshidrosis [pompholyx]: Secondary | ICD-10-CM

## 2018-11-23 NOTE — Progress Notes (Signed)
.  der

## 2018-12-15 ENCOUNTER — Encounter: Payer: Self-pay | Admitting: Family Medicine

## 2019-01-20 ENCOUNTER — Telehealth: Payer: Self-pay | Admitting: Family Medicine

## 2019-01-20 MED ORDER — VALACYCLOVIR HCL 1 G PO TABS: ORAL_TABLET | ORAL | 3 refills | Status: DC

## 2019-01-20 NOTE — Telephone Encounter (Signed)
Medication called/sent to requested pharmacy  

## 2019-01-20 NOTE — Telephone Encounter (Signed)
Refill on valtrex to [facility]

## 2019-01-22 ENCOUNTER — Other Ambulatory Visit: Payer: Self-pay

## 2019-01-22 ENCOUNTER — Ambulatory Visit: Payer: Managed Care, Other (non HMO) | Admitting: Family Medicine

## 2019-01-22 ENCOUNTER — Encounter: Payer: Self-pay | Admitting: Family Medicine

## 2019-01-22 VITALS — BP 122/84 | HR 80 | Temp 98.1°F | Resp 16 | Ht 63.0 in | Wt 282.5 lb

## 2019-01-22 DIAGNOSIS — J329 Chronic sinusitis, unspecified: Secondary | ICD-10-CM | POA: Diagnosis not present

## 2019-01-22 DIAGNOSIS — J310 Chronic rhinitis: Secondary | ICD-10-CM

## 2019-01-22 DIAGNOSIS — J069 Acute upper respiratory infection, unspecified: Secondary | ICD-10-CM | POA: Diagnosis not present

## 2019-01-22 MED ORDER — BENZONATATE 100 MG PO CAPS: 100.0000 mg | ORAL_CAPSULE | Freq: Three times a day (TID) | ORAL | 0 refills | Status: DC | PRN

## 2019-01-22 MED ORDER — HYDROCODONE-HOMATROPINE 5-1.5 MG/5ML PO SYRP: 5.0000 mL | ORAL_SOLUTION | Freq: Three times a day (TID) | ORAL | 0 refills | Status: DC | PRN

## 2019-01-22 MED ORDER — AMOXICILLIN-POT CLAVULANATE 875-125 MG PO TABS: 1.0000 | ORAL_TABLET | Freq: Two times a day (BID) | ORAL | 0 refills | Status: AC

## 2019-01-22 NOTE — Patient Instructions (Addendum)
Do over the counter meds - I highly recommend getting on an antihistamine daily for the next 2-3 weeks, sudafed and mucinex.  I will send in a cough syrup for use only at night.  Try other over the counter medicines during the day  I have sent in an antibiotic that I need you to HOLD and only start if you have sudden worsening of sinus pain/pressure with fever  If you start the antibiotic you need to hold the sudafed.  Follow up if not improving

## 2019-01-22 NOTE — Progress Notes (Signed)
[name], female    DOB: June 16, 1991, 28 y.o.   MRN: [id number]  PCP: Donita Brooks, MD  Chief Complaint  Patient presents with  . Cough    Patient in with c/o  cough, and sinus pressure and headache. Onset of symptoms Tuesday     Subjective:   Brittany [name] is a 28 y.o. female, presents to clinic with CC of URI sx - cough, sinus congestion pressure, HA x 3 days.  Cough is productive with green-yellow sputum.  Her son was sick last week with similar sx and has since gotten better.  She has some baseline seasonal nasal sx, in the past has taken zyrtec/flonase, but currently on allergy meds. No fever, sweats, chills, CP, SOB, wheeze, chest tightness, neck pain/stiffness No recent travel.  No other sick contacts.   She has remote smoking hx.  Is on methotrexate.   She reports hx of getting sinus infections requiring abx only about once a year.   No hx of pulmonary disease.      Patient Active Problem List   Diagnosis Date Noted  . Encounter for surveillance of contraceptive pills 03/16/2018  . Encounter for well woman exam with routine gynecological exam 03/16/2018  . Body mass index (BMI) of 45.0-49.9 in adult (HCC) 04/21/2017  . Weight loss counseling, encounter for 04/21/2017  . History of preterm delivery 11/08/2016  . History of cesarean delivery 11/08/2016  . History of gestational hypertension 11/08/2016  . 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 Obesity      Prior to Admission medications   Medication Sig Start Date End Date Taking? Authorizing Provider  escitalopram (LEXAPRO) 10 MG tablet TAKE 1 TABLET(10 MG) BY MOUTH DAILY 08/05/18  Yes Donita Brooks, MD  fluticasone (FLONASE) 50 MCG/ACT nasal spray Place 2 sprays into both nostrils daily. 03/21/17  Yes Donita Brooks, MD  methotrexate (RHEUMATREX) 2.5 MG tablet TK 5 TABLETS PO ONCE A WEEK 01/15/19  Yes [provider]  Norethindrone-Ethinyl Estradiol-Fe Biphas (LO LOESTRIN FE) 1 MG-10 MCG  / 10 MCG tablet Take 1 tablet by mouth daily. Take 1 daily by mouth 03/16/18  Yes Adline Potter, NP  valACYclovir (VALTREX) 1000 MG tablet Take two tablets by mouth every 12 hrs X 2 doses 01/20/19  Yes Donita Brooks, MD     No Known Allergies   Family History  Problem Relation Age of Onset  . Cancer Maternal Grandmother        breast  . Cancer Paternal Uncle        lung  . Cancer Paternal Grandfather   . Cancer Cousin        lung, brain  . Cancer Maternal Uncle        colon     Social History   Socioeconomic History  . Marital status: Single    Spouse name: Not on file  . Number of children: Not on file  . Years of education: Not on file  . Highest education level: Not on file  Occupational History  . Not on file  Social Needs  . Financial resource strain: Not on file  . Food insecurity:    Worry: Not on file    Inability: Not on file  . Transportation needs:    Medical: Not on file    Non-medical: Not on file  Tobacco Use  . Smoking status: Former Smoker    Types: Cigarettes    Last attempt to quit: 01/14/2013  Years since quitting: 6.0  . Smokeless tobacco: Never Used  Substance and Sexual Activity  . Alcohol use: Yes    Comment: occ  . Drug use: No  . Sexual activity: Yes    Birth control/protection: Pill  Lifestyle  . Physical activity:    Days per week: Not on file    Minutes per session: Not on file  . Stress: Not on file  Relationships  . Social connections:    Talks on phone: Not on file    Gets together: Not on file    Attends religious service: Not on file    Active member of club or organization: Not on file    Attends meetings of clubs or organizations: Not on file    Relationship status: Not on file  . Intimate partner violence:    Fear of current or ex partner: Not on file    Emotionally abused: Not on file    Physically abused: Not on file    Forced sexual activity: Not on file  Other Topics Concern  . Not on file  Social  History Narrative  . Not on file     Review of Systems  Constitutional: Negative.  Negative for activity change and appetite change.  HENT: Negative.  Negative for ear discharge, ear pain, trouble swallowing and voice change.   Eyes: Negative.   Respiratory: Negative.  Negative for choking, chest tightness, shortness of breath, wheezing and stridor.   Cardiovascular: Negative.  Negative for chest pain, palpitations and leg swelling.  Gastrointestinal: Negative.   Endocrine: Negative.   Genitourinary: Negative.   Musculoskeletal: Negative.   Skin: Negative.  Negative for pallor.  Neurological: Negative.  Negative for dizziness and weakness.  Hematological: Negative.  Negative for adenopathy.  Psychiatric/Behavioral: Negative.   All other systems reviewed and are negative.      Objective:    Vitals:   01/22/19 0938  BP: 122/84  Pulse: 80  Resp: 16  Temp: 98.1 F (36.7 C)  TempSrc: Oral  SpO2: 97%  Weight: 282 lb 8 oz (128.1 kg)      Physical Exam Vitals signs and nursing note reviewed.  Constitutional:      General: She is not in acute distress.    Appearance: She is well-developed. She is morbidly obese. She is not ill-appearing, toxic-appearing or diaphoretic.  HENT:     Head: Normocephalic and atraumatic.     Salivary Glands: Right salivary gland is not diffusely enlarged or tender. Left salivary gland is not diffusely enlarged or tender.     Right Ear: Hearing, tympanic membrane, ear canal and external ear normal.     Left Ear: Hearing, tympanic membrane, ear canal and external ear normal.     Nose: Mucosal edema, congestion and rhinorrhea present. Rhinorrhea is clear.     Right Turbinates: Enlarged and swollen.     Left Turbinates: Enlarged and swollen.     Right Sinus: No maxillary sinus tenderness or frontal sinus tenderness.     Left Sinus: No maxillary sinus tenderness or frontal sinus tenderness.     Mouth/Throat:     Mouth: Mucous membranes are moist.  Mucous membranes are not pale.     Palate: No lesions.     Pharynx: Uvula midline. Posterior oropharyngeal erythema (very mild) present. No pharyngeal swelling, oropharyngeal exudate or uvula swelling.     Tonsils: No tonsillar exudate or tonsillar abscesses.  Eyes:     General:        Right eye:  No discharge.        Left eye: No discharge.     Conjunctiva/sclera: Conjunctivae normal.     Pupils: Pupils are equal, round, and reactive to light.  Neck:     Musculoskeletal: Normal range of motion and neck supple.     Trachea: No tracheal deviation.  Cardiovascular:     Rate and Rhythm: Normal rate and regular rhythm.     Heart sounds: Normal heart sounds.  Pulmonary:     Effort: Pulmonary effort is normal. No tachypnea or respiratory distress.     Breath sounds: Normal breath sounds. No stridor. No decreased breath sounds, wheezing, rhonchi or rales.  Abdominal:     General: Bowel sounds are normal. There is no distension.     Palpations: Abdomen is soft.  Musculoskeletal: Normal range of motion.  Skin:    General: Skin is warm and dry.     Coloration: Skin is not pale.     Findings: No rash.  Neurological:     Mental Status: She is alert.     Motor: No abnormal muscle tone.     Coordination: Coordination normal.  Psychiatric:        Behavior: Behavior normal.           Assessment & Plan:      ICD-10-CM   1. Upper respiratory tract infection, unspecified type J06.9 HYDROcodone-homatropine (HYCODAN) 5-1.5 MG/5ML syrup    benzonatate (TESSALON) 100 MG capsule  2. Rhinosinusitis J32.9 amoxicillin-clavulanate (AUGMENTIN) 875-125 MG tablet    Suspect viral URI and poorly controlled underlying allergic rhinitis - recommend OTC meds, supportive and sx tx, restart antihistamine daily for the next 2-3 weeks, add sudafed and mucinex.  Cough syrup for at night, OTC for during the day.  antibiotic to HOLD and only start for sudden worsening of sinus pain/pressure with fever or  prolonged moderate to severe sx for more than 10 days - indication for abx tx for bacterial sinusitis.  Instructed pt to hold the sudafed if starting abx.  Follow up if not improving   Danelle Berry, PA-C 01/22/19 9:54 AM

## 2019-01-26 ENCOUNTER — Ambulatory Visit: Payer: Managed Care, Other (non HMO) | Admitting: Family Medicine

## 2019-01-28 ENCOUNTER — Encounter: Payer: Self-pay | Admitting: Family Medicine

## 2019-03-31 ENCOUNTER — Other Ambulatory Visit: Payer: Self-pay | Admitting: Adult Health

## 2019-03-31 MED ORDER — NORETHIN-ETH ESTRAD-FE BIPHAS 1 MG-10 MCG / 10 MCG PO TABS: 1.0000 | ORAL_TABLET | Freq: Every day | ORAL | 4 refills | Status: DC

## 2019-03-31 NOTE — Telephone Encounter (Signed)
Patient called, she is requesting a refill on her bc.  She has a P/P scheduled for 05/31/19 with Victorino Dike.  She stated she is completely out.  [address]  [phone]

## 2019-05-31 ENCOUNTER — Other Ambulatory Visit: Payer: Self-pay

## 2019-05-31 ENCOUNTER — Ambulatory Visit (INDEPENDENT_AMBULATORY_CARE_PROVIDER_SITE_OTHER): Payer: Managed Care, Other (non HMO) | Admitting: Adult Health

## 2019-05-31 ENCOUNTER — Encounter: Payer: Self-pay | Admitting: Adult Health

## 2019-05-31 VITALS — BP 140/96 | HR 86 | Ht 63.0 in | Wt 277.8 lb

## 2019-05-31 DIAGNOSIS — Z01419 Encounter for gynecological examination (general) (routine) without abnormal findings: Secondary | ICD-10-CM

## 2019-05-31 DIAGNOSIS — I10 Essential (primary) hypertension: Secondary | ICD-10-CM | POA: Insufficient documentation

## 2019-05-31 DIAGNOSIS — Z3041 Encounter for surveillance of contraceptive pills: Secondary | ICD-10-CM

## 2019-05-31 MED ORDER — LO LOESTRIN FE 1 MG-10 MCG / 10 MCG PO TABS: 1.0000 | ORAL_TABLET | Freq: Every day | ORAL | 4 refills | Status: DC

## 2019-05-31 MED ORDER — AMLODIPINE BESYLATE 5 MG PO TABS: 5.0000 mg | ORAL_TABLET | Freq: Every day | ORAL | 3 refills | Status: DC

## 2019-05-31 NOTE — Patient Instructions (Addendum)

## 2019-05-31 NOTE — Progress Notes (Signed)
[name], female   DOB: 1991/06/03, 28 y.o.   MRN: [id number] History of Present Illness: Brittany [name] is a 28 year old white female, G1P1, in for a well woman gyn exam, she had a normal pap with negative HPV 02/10/17.  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  PCP is Dr Dennard Schaumann.    Current Medications, Allergies, Past Medical History, Past Surgical History, Family History and Social History were reviewed in [employer] record.     Review of Systems: Patient denies any headaches, hearing loss, fatigue, blurred vision, shortness of breath, chest pain, abdominal pain, problems with bowel movements, urination, or intercourse. No joint pain or mood swings. No periods on Lo loestrin   Physical Exam:BP (!) 144/102 (BP Location: Right Arm, Patient Position: Sitting, Cuff Size: Large)   Pulse 86   Ht 5\' 3"  (1.6 m)   Wt 277 lb 12.8 oz (126 kg)   BMI 49.21 kg/m BP recheck 140/96 General:  Well developed, well nourished, no acute distress Skin:  Warm and dry Neck:  Midline trachea, normal thyroid, good ROM, no lymphadenopathy Lungs; Clear to auscultation bilaterally Breast:  No dominant palpable mass, retraction, or nipple discharge Cardiovascular: Regular rate and rhythm Abdomen:  Soft, non tender, no hepatosplenomegaly Pelvic:  External genitalia is normal in appearance, no lesions.  The vagina is normal in appearance. Urethra has no lesions or masses. The cervix is bulbous.  Uterus is felt to be normal size, shape, and contour.  No adnexal masses or tenderness noted.Bladder is non tender, no masses felt. Extremities/musculoskeletal:  No swelling or varicosities noted, no clubbing or cyanosis Psych:  No mood changes, alert and cooperative,seems happy PHQ score 0 Fall risk is low.  Exam performed with out chaperone with pt permission.  Discussed keeping BP log, trying DASH diet to limit salt and sugars and try to lose some weight, even 30 lbs would help and  drink more water and be active.  Impression: 1. Encounter for well woman exam with routine gynecological exam   2. Encounter for surveillance of contraceptive pills   3. Hypertension, unspecified type       Plan: Meds ordered this encounter  Medications  . Norethindrone-Ethinyl Estradiol-Fe Biphas (LO LOESTRIN FE) 1 MG-10 MCG / 10 MCG tablet    Sig: Take 1 tablet by mouth daily. Take 1 daily by mouth    Dispense:  3 Package    Refill:  4    BIN [id number], PCN [id number], GRP [id number]    Order Specific Question:   Supervising Provider    Answer:   Tania Ade H [[id number]]  . amLODipine (NORVASC) 5 MG tablet    Sig: Take 1 tablet (5 mg total) by mouth daily.    Dispense:  30 tablet    Refill:  3    Order Specific Question:   Supervising Provider    Answer:   Tania Ade H [[id number]]  Decrease salt and sugars, increase water and activity  Review DASH diet Follow up in 11 weeks Pap and physical ain 1 year She had had labs recently and were normal

## 2019-06-04 ENCOUNTER — Other Ambulatory Visit: Payer: Self-pay | Admitting: Adult Health

## 2019-08-16 ENCOUNTER — Ambulatory Visit: Payer: Managed Care, Other (non HMO) | Admitting: Adult Health

## 2019-09-10 ENCOUNTER — Other Ambulatory Visit: Payer: Self-pay | Admitting: Family Medicine

## 2019-10-29 ENCOUNTER — Ambulatory Visit: Payer: Managed Care, Other (non HMO) | Admitting: Family Medicine

## 2019-12-06 ENCOUNTER — Other Ambulatory Visit: Payer: Self-pay | Admitting: Family Medicine

## 2019-12-06 ENCOUNTER — Other Ambulatory Visit: Payer: Self-pay

## 2019-12-06 ENCOUNTER — Ambulatory Visit (INDEPENDENT_AMBULATORY_CARE_PROVIDER_SITE_OTHER): Payer: Managed Care, Other (non HMO) | Admitting: Family Medicine

## 2019-12-06 DIAGNOSIS — J310 Chronic rhinitis: Secondary | ICD-10-CM

## 2019-12-06 DIAGNOSIS — J329 Chronic sinusitis, unspecified: Secondary | ICD-10-CM | POA: Diagnosis not present

## 2019-12-06 MED ORDER — AMOXICILLIN 875 MG PO TABS: 875.0000 mg | ORAL_TABLET | Freq: Two times a day (BID) | ORAL | 0 refills | Status: DC

## 2019-12-06 MED ORDER — BENZONATATE 200 MG PO CAPS: 200.0000 mg | ORAL_CAPSULE | Freq: Two times a day (BID) | ORAL | 0 refills | Status: DC | PRN

## 2019-12-06 NOTE — Progress Notes (Signed)
Subjective:    [name], female    DOB: Jun 16, 1991, 29 y.o.   MRN: [id number]  HPI Patient is a 29 year old Caucasian female being seen today as a telephone visit.  She consents to be seen via telephone.  Phone call began at 1235.  Phone call concluded at 1245.  2 weeks ago her son was diagnosed with a sinus infection.  She has developed symptoms that are similar now for more than a week.  She reports headache, sinus pressure in both cheeks, postnasal drip causing sore throat and cough.  The headache and sinus pressure constant.  She denies any fever or shortness of breath or chest pain.  Symptoms have worsened gradually over the last 5 days and of the worst they have been today.  She denies any known exposure to Covid. Past Medical History:  Diagnosis Date  . Contraceptive management 03/04/2013  . HSV-1 infection   . Medical history non-contributory   . Obesity   . Pregnant 03/26/2016   Past Surgical History:  Procedure Laterality Date  . adnoids  1995  . CESAREAN SECTION N/A 10/06/2016   Procedure: CESAREAN SECTION;  Surgeon: Osborne Oman, MD;  Location: [facility];  Service: Obstetrics;  Laterality: N/A;  . CHOLECYSTECTOMY  04/2017  . WISDOM TOOTH EXTRACTION     Current Outpatient Medications on File Prior to Visit  Medication Sig Dispense Refill  . amLODipine (NORVASC) 5 MG tablet Take 1 tablet (5 mg total) by mouth daily. 30 tablet 3  . escitalopram (LEXAPRO) 10 MG tablet TAKE 1 TABLET(10 MG) BY MOUTH DAILY 30 tablet 0  . fluticasone (FLONASE) 50 MCG/ACT nasal spray Place 2 sprays into both nostrils daily. 16 g 6  . folic acid (FOLVITE) 1 MG tablet Take 1 mg by mouth daily.    . LO LOESTRIN FE 1 MG-10 MCG / 10 MCG tablet TAKE 1 TABLET BY MOUTH ONCE DAILY 84 tablet 2  . methotrexate (RHEUMATREX) 2.5 MG tablet TK 5 TABLETS PO ONCE A WEEK    . valACYclovir (VALTREX) 1000 MG tablet Take two tablets by mouth every 12 hrs X 2 doses 4 tablet 3   No current  facility-administered medications on file prior to visit.   No Known Allergies Social History   Socioeconomic History  . Marital status: Significant Other    Spouse name: Not on file  . Number of children: Not on file  . Years of education: Not on file  . Highest education level: Not on file  Occupational History  . Not on file  Tobacco Use  . Smoking status: Former Smoker    Types: Cigarettes    Quit date: 01/14/2013    Years since quitting: 6.8  . Smokeless tobacco: Never Used  Substance and Sexual Activity  . Alcohol use: Yes    Comment: occ  . Drug use: No  . Sexual activity: Yes    Birth control/protection: Pill  Other Topics Concern  . Not on file  Social History Narrative  . Not on file   Social Determinants of Health   Financial Resource Strain:   . Difficulty of Paying Living Expenses: Not on file  Food Insecurity:   . Worried About Charity fundraiser in the Last Year: Not on file  . Ran Out of Food in the Last Year: Not on file  Transportation Needs:   . Lack of Transportation (Medical): Not on file  . Lack of Transportation (Non-Medical): Not on file  Physical Activity:   . Days of Exercise per Week: Not on file  . Minutes of Exercise per Session: Not on file  Stress:   . Feeling of Stress : Not on file  Social Connections:   . Frequency of Communication with Friends and Family: Not on file  . Frequency of Social Gatherings with Friends and Family: Not on file  . Attends Religious Services: Not on file  . Active Member of Clubs or Organizations: Not on file  . Attends Banker Meetings: Not on file  . Marital Status: Not on file  Intimate Partner Violence:   . Fear of Current or Ex-Partner: Not on file  . Emotionally Abused: Not on file  . Physically Abused: Not on file  . Sexually Abused: Not on file      Review of Systems  All other systems reviewed and are negative.      Objective:   Physical Exam        Assessment &  Plan:  Rhinosinusitis  Patient symptoms sound consistent with a viral upper respiratory infection that has now developed into a secondary bacterial sinusitis.  Begin amoxicillin 875 mg p.o. twice daily for 10 days.  She can use Tessalon Perles 200 mg every 8 hours as needed for cough.  I also recommended the patient be tested for COVID-19.  She can have this done rapidly at work.  I recommended quarantining until she has the results of her Covid test back.  If she test positive for Covid obviously she will not take the amoxicillin.

## 2020-02-15 ENCOUNTER — Other Ambulatory Visit: Payer: Self-pay | Admitting: Family Medicine

## 2020-05-27 ENCOUNTER — Other Ambulatory Visit: Payer: Self-pay | Admitting: Adult Health

## 2020-06-15 ENCOUNTER — Other Ambulatory Visit: Payer: Self-pay

## 2020-06-15 ENCOUNTER — Other Ambulatory Visit (HOSPITAL_COMMUNITY)
Admission: RE | Admit: 2020-06-15 | Discharge: 2020-06-15 | Disposition: A | Discharge status: Home / Self Care | Payer: Managed Care, Other (non HMO) | Source: Ambulatory Visit | Attending: Adult Health | Admitting: Adult Health

## 2020-06-15 ENCOUNTER — Encounter: Payer: Self-pay | Admitting: Adult Health

## 2020-06-15 ENCOUNTER — Ambulatory Visit (INDEPENDENT_AMBULATORY_CARE_PROVIDER_SITE_OTHER): Payer: Managed Care, Other (non HMO) | Admitting: Adult Health

## 2020-06-15 VITALS — BP 151/93 | HR 90 | Ht 63.0 in | Wt 286.0 lb

## 2020-06-15 DIAGNOSIS — I10 Essential (primary) hypertension: Secondary | ICD-10-CM

## 2020-06-15 DIAGNOSIS — Z3202 Encounter for pregnancy test, result negative: Secondary | ICD-10-CM | POA: Diagnosis not present

## 2020-06-15 DIAGNOSIS — Z01419 Encounter for gynecological examination (general) (routine) without abnormal findings: Secondary | ICD-10-CM

## 2020-06-15 DIAGNOSIS — Z3041 Encounter for surveillance of contraceptive pills: Secondary | ICD-10-CM | POA: Diagnosis not present

## 2020-06-15 LAB — POCT URINE PREGNANCY: Preg Test, Ur: NEGATIVE

## 2020-06-15 MED ORDER — AMLODIPINE BESYLATE 5 MG PO TABS: 5.0000 mg | ORAL_TABLET | Freq: Every day | ORAL | 4 refills | Status: DC

## 2020-06-15 MED ORDER — LO LOESTRIN FE 1 MG-10 MCG / 10 MCG PO TABS: 1.0000 | ORAL_TABLET | Freq: Every day | ORAL | 4 refills | Status: DC

## 2020-06-15 NOTE — Progress Notes (Signed)
[name], female   DOB: 10-08-1991, 29 y.o.   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 PCP is Dr Tanya Nones   Current Medications, Allergies, Past Medical History, Past Surgical History, Family History and Social History were reviewed in [employer] [facility] electronic medical record.     Review of Systems: Patient denies any headaches, hearing loss, fatigue, blurred vision, shortness of breath, chest pain, abdominal pain, problems with bowel movements, urination, or intercourse. No joint pain or mood swings. Happy with OCs   Physical Exam:BP (!) 151/93 (BP Location: Left Wrist, Patient Position: Sitting, Cuff Size: Normal)   Pulse 90   Ht 5\' 3"  (1.6 m)   Wt 286 lb (129.7 kg)   BMI 50.66 kg/m    UPT is negative. General:  Well developed, well nourished, no acute distress Skin:  Warm and dry Neck:  Midline trachea, normal thyroid, good ROM, no lymphadenopathy Lungs; Clear to auscultation bilaterally Breast:  No dominant palpable mass, retraction, or nipple discharge Cardiovascular: Regular rate and rhythm Abdomen:  Soft, non tender, no hepatosplenomegaly Pelvic:  External genitalia is normal in appearance, no lesions.  The vagina is normal in appearance. Urethra has no lesions or masses. The cervix is bulbous, pap with GC/CHL and high risk HPV genotyping performed.  Uterus is felt to be normal size, shape, and contour.  No adnexal masses or tenderness noted.Bladder is non tender, no masses felt. Extremities/musculoskeletal:  No swelling or varicosities noted, no clubbing or cyanosis Psych:  No mood changes, alert and cooperative,seems happy AA is 2 Fall risk is low PHQ 9 score is 0   Upstream - 06/15/20 1505      Pregnancy Intention Screening   Does the patient want to become pregnant in the next year? No    Does the patient's partner  want to become pregnant in the next year? No    Would the patient like to discuss contraceptive options today? No      Contraception Wrap Up   Current Method Oral Contraceptive    End Method Oral Contraceptive    Contraception Counseling Provided No         Examination chaperoned by 08/15/20 LPN.  Impression and Plan: 1. Encounter for gynecological examination with Papanicolaou smear of cervix Pap sent Physical in 1 year  Pap in 3 if normal  2. Urine pregnancy test negative   3. Encounter for surveillance of contraceptive pills Will refill lo loestrin Meds ordered this encounter  Medications  . Norethindrone-Ethinyl Estradiol-Fe Biphas (LO LOESTRIN FE) 1 MG-10 MCG / 10 MCG tablet    Sig: Take 1 tablet by mouth daily.    Dispense:  84 tablet    Refill:  4    Order Specific Question:   Supervising Provider    Answer:   Nance Pear, LUTHER H [[id number]]  . amLODipine (NORVASC) 5 MG tablet    Sig: Take 1 tablet (5 mg total) by mouth daily.    Dispense:  90 tablet    Refill:  4    Order Specific Question:   Supervising Provider    Answer:   Despina Hidden, LUTHER H [[id number]]    4. Hypertension, unspecified type Continue Norvasc, will refill She says BP has been good, checks at work Keep check at work

## 2020-06-19 LAB — CYTOLOGY - PAP
Chlamydia: NEGATIVE
Comment: NEGATIVE
Comment: NEGATIVE
Comment: NORMAL
Diagnosis: NEGATIVE
High risk HPV: NEGATIVE
Neisseria Gonorrhea: NEGATIVE

## 2020-07-21 ENCOUNTER — Other Ambulatory Visit: Payer: Self-pay | Admitting: Family Medicine

## 2021-03-13 ENCOUNTER — Other Ambulatory Visit: Payer: Self-pay | Admitting: Family Medicine

## 2021-04-06 ENCOUNTER — Telehealth: Payer: Self-pay | Admitting: Family Medicine

## 2021-04-06 NOTE — Telephone Encounter (Signed)
Received FMLA forms.   Call placed to patient for more information.   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   Forms routed to provider.

## 2021-04-06 NOTE — Telephone Encounter (Signed)
Awaiting forms

## 2021-04-06 NOTE — Telephone Encounter (Signed)
Pt came in to drop off ppw for FMLA. Forms place in nurse's folder.  Cb#: [phone]

## 2021-06-26 ENCOUNTER — Other Ambulatory Visit: Payer: Self-pay

## 2021-06-26 ENCOUNTER — Telehealth: Payer: Self-pay | Admitting: Adult Health

## 2021-06-26 MED ORDER — LO LOESTRIN FE 1 MG-10 MCG / 10 MCG PO TABS: 1.0000 | ORAL_TABLET | Freq: Every day | ORAL | 4 refills | Status: DC

## 2021-06-26 NOTE — Telephone Encounter (Signed)
Pt requesting refill of birth control until her physical here 08/14/2021  Please advise & notify pt   [facility]

## 2021-08-14 ENCOUNTER — Encounter: Payer: Self-pay | Admitting: Family Medicine

## 2021-08-14 ENCOUNTER — Ambulatory Visit: Payer: Managed Care, Other (non HMO) | Admitting: Family Medicine

## 2021-08-14 ENCOUNTER — Other Ambulatory Visit: Payer: Self-pay

## 2021-08-14 ENCOUNTER — Other Ambulatory Visit: Payer: Managed Care, Other (non HMO) | Admitting: Adult Health

## 2021-08-14 VITALS — BP 136/88 | HR 82 | Temp 98.7°F | Resp 16 | Ht 63.0 in | Wt 284.0 lb

## 2021-08-14 DIAGNOSIS — R739 Hyperglycemia, unspecified: Secondary | ICD-10-CM

## 2021-08-14 DIAGNOSIS — Z713 Dietary counseling and surveillance: Secondary | ICD-10-CM | POA: Diagnosis not present

## 2021-08-14 DIAGNOSIS — Z6841 Body Mass Index (BMI) 40.0 and over, adult: Secondary | ICD-10-CM | POA: Diagnosis not present

## 2021-08-14 NOTE — Progress Notes (Signed)
Subjective:    [name], female    DOB: Oct 18, 1991, 30 y.o.   MRN: [id number]  HPI  Patient is a very pleasant 30 year old Caucasian female who presents today to discuss weight loss.  She is not trying to get pregnant or conceive at the present time.  Her weight today is 284 pounds.  This gives her a BMI of 50.  Blood pressure is borderline elevated 136/88.  She has not had lab work here in quite some time although I do see a blood sugar in her chart that is elevated.  She is not certain if that was fasting.  She denies any polyuria polydipsia or blurry vision.  She has tried Adipex in the past and had success with that however she gained weight back after she got pregnant.  She has never tried Topamax.  Biggest issue seems to be appetite control. Past Medical History:  Diagnosis Date   Contraceptive management 03/04/2013   HSV-1 infection    Hypertension    Medical history non-contributory    Obesity    Pregnant 03/26/2016   Past Surgical History:  Procedure Laterality Date   adnoids  1995   CESAREAN SECTION N/A 10/06/2016   Procedure: CESAREAN SECTION;  Surgeon: Tereso Newcomer, MD;  [facility]: [facility];  Service: Obstetrics;  Laterality: N/A;   CHOLECYSTECTOMY  04/2017   WISDOM TOOTH EXTRACTION     Current Outpatient Medications on File Prior to Visit  Medication Sig Dispense Refill   amLODipine (NORVASC) 5 MG tablet Take 1 tablet (5 mg total) by mouth daily. 90 tablet 4   escitalopram (LEXAPRO) 10 MG tablet TAKE 1 TABLET(10 MG) BY MOUTH DAILY 30 tablet 5   methotrexate (RHEUMATREX) 2.5 MG tablet Take 2.5 mg by mouth once a week.     Norethindrone-Ethinyl Estradiol-Fe Biphas (LO LOESTRIN FE) 1 MG-10 MCG / 10 MCG tablet Take 1 tablet by mouth daily. 84 tablet 4   valACYclovir (VALTREX) 1000 MG tablet TAKE 2 TABLETS BY MOUTH EVERY 12 HOURS FOR 2 DOSES 4 tablet 3   No current facility-administered medications on file prior to visit.   No Known  Allergies Social History   Socioeconomic History   Marital status: Significant Other    Spouse name: Not on file   Number of children: Not on file   Years of education: Not on file   Highest education level: Not on file  Occupational History   Not on file  Tobacco Use   Smoking status: Former    Types: Cigarettes    Quit date: 01/14/2013    Years since quitting: 8.5   Smokeless tobacco: Never  Vaping Use   Vaping Use: Some days  Substance and Sexual Activity   Alcohol use: Yes    Comment: occ   Drug use: No   Sexual activity: Yes    Birth control/protection: Pill  Other Topics Concern   Not on file  Social History Narrative   Not on file   Social Determinants of Health   Financial Resource Strain: Not on file  Food Insecurity: Not on file  Transportation Needs: Not on file  Physical Activity: Not on file  Stress: Not on file  Social Connections: Not on file  Intimate Partner Violence: Not on file    Review of Systems  All other systems reviewed and are negative.     Objective:   Physical Exam Vitals reviewed.  Neck:     Thyroid: No thyromegaly.  Cardiovascular:  Rate and Rhythm: Normal rate and regular rhythm.     Heart sounds: Normal heart sounds.  Pulmonary:     Effort: Pulmonary effort is normal. No respiratory distress.     Breath sounds: Normal breath sounds. No wheezing or rales.  Abdominal:     General: Bowel sounds are normal.     Palpations: Abdomen is soft.  Lymphadenopathy:     Cervical: No cervical adenopathy.          Assessment & Plan:  Elevated blood sugar - Plan: COMPLETE METABOLIC PANEL WITH GFR, Hemoglobin A1c  Weight loss counseling, encounter for  BMI 50.0-59.9, adult (HCC) Patient would benefit dramatically from weight loss.  I will check lab work to see if the patient has type 2 diabetes including a CMP and an A1c.  If her A1c is elevated I will try to get the patient on Ozempic starting at 0.5 mg subcu weekly and  uptitrating monthly as the patient can tolerate to the maximum dose of 2.4 mg.  If the patient does not have diabetes I will try to get her covered for [facility]

## 2021-08-15 LAB — COMPLETE METABOLIC PANEL WITH GFR
AG Ratio: 1.1 (calc) (ref 1.0–2.5)
ALT: 23 U/L (ref 6–29)
AST: 24 U/L (ref 10–30)
Albumin: 4 g/dL (ref 3.6–5.1)
Alkaline phosphatase (APISO): 57 U/L (ref 31–125)
BUN: 14 mg/dL (ref 7–25)
CO2: 25 mmol/L (ref 20–32)
Calcium: 9.9 mg/dL (ref 8.6–10.2)
Chloride: 101 mmol/L (ref 98–110)
Creat: 0.94 mg/dL (ref 0.50–0.97)
Globulin: 3.5 g/dL (calc) (ref 1.9–3.7)
Glucose, Bld: 82 mg/dL (ref 65–99)
Potassium: 4.3 mmol/L (ref 3.5–5.3)
Sodium: 136 mmol/L (ref 135–146)
Total Bilirubin: 0.3 mg/dL (ref 0.2–1.2)
Total Protein: 7.5 g/dL (ref 6.1–8.1)
eGFR: 83 mL/min/{1.73_m2} (ref >=60)

## 2021-08-15 LAB — HEMOGLOBIN A1C
Hgb A1c MFr Bld: 5.7 % of total Hgb — ABNORMAL HIGH (ref <5.7)
Mean Plasma Glucose: 117 mg/dL
eAG (mmol/L): 6.5 mmol/L

## 2021-08-16 ENCOUNTER — Other Ambulatory Visit: Payer: Self-pay | Admitting: *Deleted

## 2021-08-16 MED ORDER — OZEMPIC (0.25 OR 0.5 MG/DOSE) 2 MG/1.5ML ~~LOC~~ SOPN: PEN_INJECTOR | SUBCUTANEOUS | 11 refills | Status: DC

## 2021-09-06 ENCOUNTER — Ambulatory Visit (HOSPITAL_COMMUNITY)
Admission: RE | Admit: 2021-09-06 | Discharge: 2021-09-06 | Disposition: A | Discharge status: Home / Self Care | Payer: Managed Care, Other (non HMO) | Source: Ambulatory Visit | Attending: Family Medicine | Admitting: Family Medicine

## 2021-09-06 ENCOUNTER — Other Ambulatory Visit: Payer: Self-pay

## 2021-09-06 ENCOUNTER — Ambulatory Visit: Payer: Managed Care, Other (non HMO) | Admitting: Family Medicine

## 2021-09-06 VITALS — BP 122/82 | HR 80 | Temp 98.2°F | Wt 281.8 lb

## 2021-09-06 DIAGNOSIS — R051 Acute cough: Secondary | ICD-10-CM | POA: Insufficient documentation

## 2021-09-06 MED ORDER — HYDROCODONE BIT-HOMATROP MBR 5-1.5 MG/5ML PO SOLN: 5.0000 mL | Freq: Three times a day (TID) | ORAL | 0 refills | Status: DC | PRN

## 2021-09-06 MED ORDER — PREDNISONE 20 MG PO TABS: ORAL_TABLET | ORAL | 0 refills | Status: DC

## 2021-09-06 MED ORDER — AZITHROMYCIN 250 MG PO TABS: ORAL_TABLET | ORAL | 0 refills | Status: DC

## 2021-09-06 NOTE — Progress Notes (Signed)
Subjective:    [name], female    DOB: 04/19/1991, 30 y.o.   MRN: [id number]  HPI Patient was diagnosed with COVID 3 weeks ago.  She continues to have a cough productive of green and brown sputum.  She also reports some tightness in her chest.  She denies any pleurisy or hemoptysis.  She denies any fevers or chills.  She does report some mild dyspnea on exertion and wheezing.  She also reports postnasal drip and nasal congestion.  Her son was diagnosed with pneumonia after he had COVID and took amoxicillin Past Medical History:  Diagnosis Date   Contraceptive management 03/04/2013   HSV-1 infection    Hypertension    Medical history non-contributory    Obesity    Pregnant 03/26/2016   Past Surgical History:  Procedure Laterality Date   adnoids  1995   CESAREAN SECTION N/A 10/06/2016   Procedure: CESAREAN SECTION;  Surgeon: Tereso Newcomer, MD;  [facility]: [facility];  Service: Obstetrics;  Laterality: N/A;   CHOLECYSTECTOMY  04/2017   WISDOM TOOTH EXTRACTION     Current Outpatient Medications on File Prior to Visit  Medication Sig Dispense Refill   amLODipine (NORVASC) 5 MG tablet Take 1 tablet (5 mg total) by mouth daily. 90 tablet 4   escitalopram (LEXAPRO) 10 MG tablet TAKE 1 TABLET(10 MG) BY MOUTH DAILY 30 tablet 5   methotrexate (RHEUMATREX) 2.5 MG tablet Take 2.5 mg by mouth once a week.     Norethindrone-Ethinyl Estradiol-Fe Biphas (LO LOESTRIN FE) 1 MG-10 MCG / 10 MCG tablet Take 1 tablet by mouth daily. 84 tablet 4   Semaglutide,0.25 or 0.5MG /DOS, (OZEMPIC, 0.25 OR 0.5 MG/DOSE,) 2 MG/1.5ML SOPN Inject 0.25mg  SQ QW x4 weeks, then increase to 0.5mg  SQ QW. 1.5 mL 11   valACYclovir (VALTREX) 1000 MG tablet TAKE 2 TABLETS BY MOUTH EVERY 12 HOURS FOR 2 DOSES 4 tablet 3   No current facility-administered medications on file prior to visit.   No Known Allergies Social History   Socioeconomic History   Marital status: Significant Other    Spouse  name: Not on file   Number of children: Not on file   Years of education: Not on file   Highest education level: Not on file  Occupational History   Not on file  Tobacco Use   Smoking status: Former    Types: Cigarettes    Quit date: 01/14/2013    Years since quitting: 8.6   Smokeless tobacco: Never  Vaping Use   Vaping Use: Some days  Substance and Sexual Activity   Alcohol use: Yes    Comment: occ   Drug use: No   Sexual activity: Yes    Birth control/protection: Pill  Other Topics Concern   Not on file  Social History Narrative   Not on file   Social Determinants of Health   Financial Resource Strain: Not on file  Food Insecurity: Not on file  Transportation Needs: Not on file  Physical Activity: Not on file  Stress: Not on file  Social Connections: Not on file  Intimate Partner Violence: Not on file    Review of Systems  All other systems reviewed and are negative.     Objective:   Physical Exam Vitals reviewed.  Neck:     Thyroid: No thyromegaly.  Cardiovascular:     Rate and Rhythm: Normal rate and regular rhythm.     Heart sounds: Normal heart sounds.  Pulmonary:  Effort: Pulmonary effort is normal. No respiratory distress.     Breath sounds: Normal breath sounds. No wheezing or rales.  Abdominal:     General: Bowel sounds are normal.     Palpations: Abdomen is soft.  Lymphadenopathy:     Cervical: No cervical adenopathy.          Assessment & Plan:  Acute cough - Plan: DG Chest 2 View I believe this is likely long COVID or post viral inflammation in the lungs.  She is having some reactive airway disease I will treat her with prednisone.  I will order a chest x-ray.  There is no evidence of pneumonia on her physical exam however if pneumonia seen on the chest x-ray I will treat her with a Z-Pak.  I will give her Hycodan 1 teaspoon every 6 hours as needed for cough

## 2021-09-21 ENCOUNTER — Encounter: Payer: Self-pay | Admitting: Adult Health

## 2021-09-21 ENCOUNTER — Other Ambulatory Visit (HOSPITAL_COMMUNITY)
Admission: RE | Admit: 2021-09-21 | Discharge: 2021-09-21 | Disposition: A | Discharge status: Home / Self Care | Payer: Managed Care, Other (non HMO) | Source: Ambulatory Visit | Attending: Adult Health | Admitting: Adult Health

## 2021-09-21 ENCOUNTER — Ambulatory Visit (INDEPENDENT_AMBULATORY_CARE_PROVIDER_SITE_OTHER): Payer: Managed Care, Other (non HMO) | Admitting: Adult Health

## 2021-09-21 ENCOUNTER — Other Ambulatory Visit: Payer: Self-pay

## 2021-09-21 VITALS — BP 142/93 | HR 74 | Ht 62.25 in | Wt 277.5 lb

## 2021-09-21 DIAGNOSIS — I10 Essential (primary) hypertension: Secondary | ICD-10-CM

## 2021-09-21 DIAGNOSIS — Z113 Encounter for screening for infections with a predominantly sexual mode of transmission: Secondary | ICD-10-CM

## 2021-09-21 DIAGNOSIS — Z3041 Encounter for surveillance of contraceptive pills: Secondary | ICD-10-CM | POA: Diagnosis not present

## 2021-09-21 DIAGNOSIS — Z01419 Encounter for gynecological examination (general) (routine) without abnormal findings: Secondary | ICD-10-CM

## 2021-09-21 MED ORDER — AMLODIPINE BESYLATE 5 MG PO TABS
5.0000 mg | ORAL_TABLET | Freq: Every day | ORAL | 4 refills | Status: DC
Start: 2021-09-21 — End: 2022-04-11

## 2021-09-21 MED ORDER — NORETHINDRONE 0.35 MG PO TABS: 1.0000 | ORAL_TABLET | Freq: Every day | ORAL | 4 refills | Status: DC

## 2021-09-21 NOTE — Progress Notes (Signed)
[name], female   DOB: Apr 07, 1991, 30 y.o.   MRN: [id number] History of Present Illness: Brittany [name] is a 30 year old white female with SO, G1P0101, in for a well woman gyn exam and said insurance won't cover lo Loestrin any more. Lab Results  Component Value Date   DIAGPAP  06/15/2020    - Negative for intraepithelial lesion or malignancy (NILM)   HPVHIGH Negative 06/15/2020    PCP is Dr Darlyn Read  Current Medications, Allergies, Past Medical History, Past Surgical History, Family History and Social History were reviewed in [employer] electronic medical record.     Review of Systems: Patient denies any daily headaches, hearing loss, fatigue, blurred vision, shortness of breath, chest pain, abdominal pain, problems with bowel movements, urination, or intercourse. No joint pain or mood swings.     Physical Exam:BP (!) 142/93 (BP Location: Left Arm, Cuff Size: Large)   Pulse 74   Ht 5' 2.25" (1.581 m)   Wt 277 lb 8 oz (125.9 kg)   LMP  (LMP Unknown)   BMI 50.35 kg/m   General:  Well developed, well nourished, no acute distress Skin:  Warm and dry Neck:  Midline trachea, normal thyroid, good ROM, no lymphadenopathy Lungs; Clear to auscultation bilaterally Breast:  No dominant palpable mass, retraction, or nipple discharge Cardiovascular: Regular rate and rhythm Abdomen:  Soft, non tender, no hepatosplenomegaly Pelvic:  External genitalia is normal in appearance, no lesions.  The vagina is normal in appearance. Urethra has no lesions or masses. The cervix is bulbous.CV swab obtained.  Uterus is felt to be normal size, shape, and contour.  No adnexal masses or tenderness noted.Bladder is non tender, no masses felt. Rectal: Deferred Extremities/musculoskeletal:  No swelling or varicosities noted, no clubbing or cyanosis Psych:  No mood changes, alert and cooperative,seems happy AA is 4 Fall risk is low Depression screen [facility] 2/9 09/21/2021 06/15/2020 05/31/2019  Decreased  Interest 0 0 0  Down, Depressed, Hopeless 1 0 0  PHQ - 2 Score 1 0 0  Altered sleeping 1 0 -  Tired, decreased energy 1 0 -  Change in appetite 1 0 -  Feeling bad or failure about yourself  0 0 -  Trouble concentrating 0 0 -  Moving slowly or fidgety/restless 0 0 -  Suicidal thoughts 0 0 -  PHQ-9 Score 4 0 -  Difficult doing work/chores - Not difficult at all -    GAD 7 : Generalized Anxiety Score 09/21/2021 06/15/2020  Nervous, Anxious, on Edge 1 0  Control/stop worrying 0 0  Worry too much - different things 1 0  Trouble relaxing 0 0  Restless 0 0  Easily annoyed or irritable 0 0  Afraid - awful might happen 0 0  Total GAD 7 Score 2 0  Anxiety Difficulty - Not difficult at all    Upstream - 09/21/21 1222       Pregnancy Intention Screening   Does the patient want to become pregnant in the next year? No    Does the patient's partner want to become pregnant in the next year? No    Would the patient like to discuss contraceptive options today? Yes      Contraception Wrap Up   Current Method Oral Contraceptive    End Method Oral Contraceptive    Contraception Counseling Provided Yes              Examination chaperoned by Malachy Mood LPN   Impression and Plan:  1. Encounter for well woman exam with routine gynecological exam Physical in 1 year Pap in 2024  2. Screening examination for STD (sexually transmitted disease) CV swab sent for GC/CHL,trich and BV, yeast   3. Encounter for surveillance of contraceptive pills Finish current pill pack and start micronor and use condoms for 1 pack    4. Hypertension, unspecified type Continue norvasc Meds ordered this encounter  Medications   norethindrone (MICRONOR) 0.35 MG tablet    Sig: Take 1 tablet (0.35 mg total) by mouth daily.    Dispense:  84 tablet    Refill:  4    Order Specific Question:   Supervising Provider    Answer:   Duane Lope H [[id number]]   amLODipine (NORVASC) 5 MG tablet    Sig: Take 1 tablet (5  mg total) by mouth daily.    Dispense:  90 tablet    Refill:  4    Order Specific Question:   Supervising Provider    Answer:   Duane Lope H [[id number]]   Follow up in 3 months for ROS and BP check

## 2021-09-25 ENCOUNTER — Other Ambulatory Visit: Payer: Self-pay | Admitting: Adult Health

## 2021-09-25 LAB — CERVICOVAGINAL ANCILLARY ONLY
Bacterial Vaginitis (gardnerella): POSITIVE — AB
Candida Glabrata: NEGATIVE
Candida Vaginitis: NEGATIVE
Chlamydia: NEGATIVE
Comment: NEGATIVE
Comment: NEGATIVE
Comment: NEGATIVE
Comment: NEGATIVE
Comment: NEGATIVE
Comment: NORMAL
Neisseria Gonorrhea: NEGATIVE
Trichomonas: NEGATIVE

## 2021-09-25 MED ORDER — METRONIDAZOLE 500 MG PO TABS: 500.0000 mg | ORAL_TABLET | Freq: Two times a day (BID) | ORAL | 0 refills | Status: DC

## 2021-09-25 NOTE — Progress Notes (Signed)
+  BV on vaginal swab will  rx flagyl,no sex or alcohol during treatment  °

## 2021-10-23 ENCOUNTER — Encounter: Payer: Self-pay | Admitting: Family Medicine

## 2021-10-30 MED ORDER — OZEMPIC (0.25 OR 0.5 MG/DOSE) 2 MG/1.5ML ~~LOC~~ SOPN: PEN_INJECTOR | SUBCUTANEOUS | 3 refills | Status: DC

## 2021-12-03 ENCOUNTER — Other Ambulatory Visit: Payer: Self-pay | Admitting: Family Medicine

## 2021-12-17 ENCOUNTER — Encounter: Payer: Self-pay | Admitting: Family Medicine

## 2021-12-17 MED ORDER — SEMAGLUTIDE (1 MG/DOSE) 4 MG/3ML ~~LOC~~ SOPN: 1.0000 mg | PEN_INJECTOR | SUBCUTANEOUS | 1 refills | Status: DC

## 2022-01-07 ENCOUNTER — Encounter: Payer: Self-pay | Admitting: Family Medicine

## 2022-01-10 ENCOUNTER — Telehealth: Payer: Managed Care, Other (non HMO) | Admitting: Adult Health

## 2022-01-11 ENCOUNTER — Ambulatory Visit: Payer: Managed Care, Other (non HMO) | Admitting: Adult Health

## 2022-01-11 ENCOUNTER — Encounter: Payer: Self-pay | Admitting: Adult Health

## 2022-01-11 VITALS — BP 138/71

## 2022-01-11 DIAGNOSIS — I10 Essential (primary) hypertension: Secondary | ICD-10-CM

## 2022-01-11 DIAGNOSIS — Z3041 Encounter for surveillance of contraceptive pills: Secondary | ICD-10-CM

## 2022-01-11 DIAGNOSIS — N898 Other specified noninflammatory disorders of vagina: Secondary | ICD-10-CM

## 2022-01-11 MED ORDER — METRONIDAZOLE 500 MG PO TABS: 500.0000 mg | ORAL_TABLET | Freq: Two times a day (BID) | ORAL | 1 refills | Status: DC

## 2022-01-11 NOTE — Progress Notes (Signed)
[name], female   DOB: [birth year], 31 y.o.   MRN: [id number] ? ? ?TELEHEALTH GYNECOLOGY VISIT ENCOUNTER NOTE ? ?Provider location: [facility] [employer] [facility]  ? ?Patient location: work ? ?I connected with Brittany [name] on 01/11/22 at  8:30 AM EST by telephone and verified that I am speaking with the correct person using two identifiers. Patient was unable to do MyChart audiovisual encounter due to technical difficulties, she tried several times.  ?  ?I discussed the limitations, risks, security and privacy concerns of performing an evaluation and management service by telephone and the availability of in person appointments. I also discussed with the patient that there may be a patient responsible charge related to this service. The patient expressed understanding and agreed to proceed. ?  ?History:  ?Brittany [name] is a 31 y.o. G8P0101 female being evaluated today for 3 month BP check and ROS on POP, and has vaginal odor and itching, like BV.Marland Kitchen She denies any abnormal bleeding, pelvic pain or other concerns.   ?  ?  ?Past Medical History:  ?Diagnosis Date  ? Contraceptive management 03/04/2013  ? HSV-1 infection   ? Hypertension   ? Medical history non-contributory   ? Obesity   ? Pregnant 03/26/2016  ? ?Past Surgical History:  ?Procedure Laterality Date  ? adnoids  1995  ? CESAREAN SECTION N/A 10/06/2016  ? Procedure: CESAREAN SECTION;  Surgeon: Osborne Oman, MD;  Location: [facility];  Service: Obstetrics;  Laterality: N/A;  ? CHOLECYSTECTOMY  04/2017  ? WISDOM TOOTH EXTRACTION    ? ?The following portions of the patient's history were reviewed and updated as appropriate: allergies, current medications, past family history, past medical history, past social history, past surgical history and problem list.  ? ?Health Maintenance:  ?Lab Results  ?Component Value Date  ? DIAGPAP  06/15/2020  ?  - Negative for intraepithelial lesion or malignancy (NILM)  ? [place]  Negative 06/15/2020  ?   ? ?Review of Systems:  ?Pertinent items noted in HPI and remainder of comprehensive ROS otherwise negative. ? ?Physical Exam:  ? ?General:  Alert, oriented and cooperative.   ?Mental Status: Normal mood and affect perceived. Normal judgment and thought content.  ?Physical exam deferred due to nature of the encounter ?BP 138/71 (BP Location: Right Arm, Patient Position: Sitting, Cuff Size: Normal)   ? Upstream - 01/11/22 0856   ? ?  ? Pregnancy Intention Screening  ? Does the patient want to become pregnant in the next year? No   ? Does the patient's partner want to become pregnant in the next year? No   ? Would the patient like to discuss contraceptive options today? Yes   ?  ? Contraception Wrap Up  ? Current Method Oral Contraceptive   ? End Method Oral Contraceptive   ? Contraception Counseling Provided Yes   ? ?  ?  ? ?  ?  ?Labs and Imaging ?No results found for this or any previous visit (from the past 336 hour(s)). ?No results found.    ?Assessment and Plan:  ?   ? 1. Encounter for surveillance of contraceptive pills ?Continue Micronor, has refills  ? ?2. Hypertension, unspecified type ?Continue Norvasc 5 mg 1 daily, has refills ? ? ?3. Vaginal odor ?Feels like when had BV ?Will rx flagyl ?Meds ordered this encounter  ?Medications  ? metroNIDAZOLE (FLAGYL) 500 MG tablet  ?  Sig: Take 1 tablet (500 mg total) by  mouth 2 (two) times daily.  ?  Dispense:  14 tablet  ?  Refill:  1  ?  Order Specific Question:   Supervising Provider  ?  Answer:   Tania Ade H [[id number]]  ? Follow up prn  ? ?4. Itching in the vaginal area ?Will rx flagyl  ?   ?  ?I discussed the assessment and treatment plan with the patient. The patient was provided an opportunity to ask questions and all were answered. The patient agreed with the plan and demonstrated an understanding of the instructions. ?  ?The patient was advised to call back or seek an in-person evaluation/go to the ED if the symptoms worsen or if the  condition fails to improve as anticipated. ? ?I provided 10 minutes of non-face-to-face time during this encounter. ?I was in my office at [facility] during this encounter  ? ?Derrek Monaco, NP ?[facility] [place]  ?

## 2022-02-01 ENCOUNTER — Encounter: Payer: Self-pay | Admitting: Family Medicine

## 2022-02-06 ENCOUNTER — Encounter: Payer: Self-pay | Admitting: Family Medicine

## 2022-02-08 ENCOUNTER — Other Ambulatory Visit: Payer: Self-pay | Admitting: Family Medicine

## 2022-02-08 ENCOUNTER — Encounter: Payer: Self-pay | Admitting: Family Medicine

## 2022-02-14 ENCOUNTER — Other Ambulatory Visit: Payer: Self-pay | Admitting: Family Medicine

## 2022-02-14 MED ORDER — WEGOVY 1.7 MG/0.75ML ~~LOC~~ SOAJ: 1.7000 mg | SUBCUTANEOUS | 1 refills | Status: DC

## 2022-02-17 ENCOUNTER — Ambulatory Visit
Admission: EM | Admit: 2022-02-17 | Discharge: 2022-02-17 | Disposition: A | Discharge status: Home / Self Care | Payer: Managed Care, Other (non HMO) | Attending: Family Medicine | Admitting: Family Medicine

## 2022-02-17 DIAGNOSIS — Z20818 Contact with and (suspected) exposure to other bacterial communicable diseases: Secondary | ICD-10-CM | POA: Diagnosis present

## 2022-02-17 DIAGNOSIS — H66002 Acute suppurative otitis media without spontaneous rupture of ear drum, left ear: Secondary | ICD-10-CM | POA: Insufficient documentation

## 2022-02-17 DIAGNOSIS — J039 Acute tonsillitis, unspecified: Secondary | ICD-10-CM | POA: Diagnosis not present

## 2022-02-17 LAB — POCT RAPID STREP A (OFFICE): Rapid Strep A Screen: NEGATIVE

## 2022-02-17 MED ORDER — LIDOCAINE VISCOUS HCL 2 % MT SOLN: 10.0000 mL | OROMUCOSAL | 0 refills | Status: DC | PRN

## 2022-02-17 MED ORDER — AMOXICILLIN 875 MG PO TABS: 875.0000 mg | ORAL_TABLET | Freq: Two times a day (BID) | ORAL | 0 refills | Status: DC

## 2022-02-17 NOTE — ED Triage Notes (Signed)
Pt states her son tested positive for Strep on Wednesday and her throat started hurting Friday ? ?Pt states she is having pain in her left ear ? ?Pt states she had a fever Saturday through last night ? ?Pt states she took Ibuprofen for the fever and it worked ?

## 2022-02-17 NOTE — ED Provider Notes (Signed)
?[facility] ? ? ? ?CSN: [id number] ?Arrival date & time: 02/17/22  1042 ? ? ?  ? ?History   ?Chief Complaint ?Chief Complaint  ?Patient presents with  ? Sore Throat  ?  Earache. Fever - Entered by patient  ? ? ?HPI ?Brittany [name] is a 31 y.o. female.  ? ?Presenting today with 3-day history of sore, swollen feeling throat, now with sharp shooting constant left ear pain and muffled hearing as well.  Has had fevers the past 2 nights overnight improved with ibuprofen.  Denies cough, congestion, chest pain, shortness of breath, abdominal pain, nausea vomiting or diarrhea.  Her child was diagnosed with strep last week. ? ? ?Past Medical History:  ?Diagnosis Date  ? Contraceptive management 03/04/2013  ? HSV-1 infection   ? Hypertension   ? Medical history non-contributory   ? Obesity   ? Pregnant 03/26/2016  ? ? ?Patient Active Problem List  ? Diagnosis Date Noted  ? Itching in the vaginal area 01/11/2022  ? Vaginal odor 01/11/2022  ? Screening examination for STD (sexually transmitted disease) 09/21/2021  ? Urine pregnancy test negative 06/15/2020  ? Encounter for gynecological examination with Papanicolaou smear of cervix 06/15/2020  ? Hypertension 05/31/2019  ? Encounter for surveillance of contraceptive pills 03/16/2018  ? Encounter for well woman exam with routine gynecological exam 03/16/2018  ? Body mass index (BMI) of 45.0-49.9 in adult [facility]) 04/21/2017  ? Weight loss counseling, encounter for 04/21/2017  ? History of preterm delivery 11/08/2016  ? History of cesarean delivery 11/08/2016  ? History of gestational hypertension 11/08/2016  ? 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 Obesity   ? ? ?Past Surgical History:  ?Procedure Laterality Date  ? adnoids  1995  ? CESAREAN SECTION N/A 10/06/2016  ? Procedure: CESAREAN SECTION;  Surgeon: Tereso Newcomer, MD;  Location: [facility];  Service: Obstetrics;  Laterality: N/A;  ? CHOLECYSTECTOMY  04/2017  ? WISDOM TOOTH EXTRACTION    ? ? ?OB History   ? ? Gravida   ?1  ? Para  ?1  ? Term  ?0  ? Preterm  ?1  ? AB  ?0  ? Living  ?1  ?  ? ? SAB  ?0  ? IAB  ?0  ? Ectopic  ?0  ? Multiple  ?0  ? Live Births  ?1  ?   ?  ?  ? ? ? ?Home Medications   ? ?Prior to Admission medications   ?Medication Sig Start Date End Date Taking? Authorizing Provider  ?amoxicillin (AMOXIL) 875 MG tablet Take 1 tablet (875 mg total) by mouth 2 (two) times daily. 02/17/22  Yes Particia Nearing, PA-C  ?lidocaine (XYLOCAINE) 2 % solution Use as directed 10 mLs in the mouth or throat every 3 (three) hours as needed for mouth pain. 02/17/22  Yes Particia Nearing, PA-C  ?amLODipine (NORVASC) 5 MG tablet Take 1 tablet (5 mg total) by mouth daily. 09/21/21   Adline Potter, NP  ?escitalopram (LEXAPRO) 10 MG tablet TAKE 1 TABLET(10 MG) BY MOUTH DAILY 12/04/21   Donita Brooks, MD  ?methotrexate (RHEUMATREX) 2.5 MG tablet Take 2.5 mg by mouth once a week. ?Patient not taking: Reported on 01/11/2022    [provider]  ?metroNIDAZOLE (FLAGYL) 500 MG tablet Take 1 tablet (500 mg total) by mouth 2 (two) times daily. 01/11/22   Adline Potter, NP  ?norethindrone (MICRONOR) 0.35 MG tablet Take 1 tablet (0.35 mg total) by mouth daily.  09/21/21   Adline Potter, NP  ?Semaglutide, 1 MG/DOSE, 4 MG/3ML SOPN Inject 1 mg as directed once a week. 12/17/21   Donita Brooks, MD  ?Semaglutide-Weight Management (WEGOVY) 1.7 MG/0.75ML SOAJ Inject 1.7 mg into the skin once a week. 02/14/22   Donita Brooks, MD  ?valACYclovir (VALTREX) 1000 MG tablet TAKE 2 TABLETS BY MOUTH EVERY 12 HOURS FOR 2 DOSES 03/13/21   Donita Brooks, MD  ? ? ?Family History ?Family History  ?Problem Relation Age of Onset  ? Cancer Maternal Grandmother   ?     breast  ? Cancer Paternal Uncle   ?     lung  ? Cancer Paternal Grandfather   ? Cancer Father   ?     lung  ? Cancer Cousin   ?     lung, brain  ? Cancer Maternal Uncle   ?     colon  ? ? ?Social History ?Social History  ? ?Tobacco Use  ? Smoking status: Former  ?   Types: Cigarettes  ?  Quit date: 01/14/2013  ?  Years since quitting: 9.0  ? Smokeless tobacco: Never  ?Vaping Use  ? Vaping Use: Some days  ? Substances: Nicotine, Flavoring  ?Substance Use Topics  ? Alcohol use: Yes  ?  Comment: occ  ? Drug use: No  ? ? ? ?Allergies   ?Patient has no known allergies. ? ? ?Review of Systems ?Review of Systems ?Per HPI ? ?Physical Exam ?Triage Vital Signs ?ED Triage Vitals  ?Enc Vitals Group  ?   BP 02/17/22 1059 118/85  ?   Pulse Rate 02/17/22 1059 81  ?   Resp 02/17/22 1059 20  ?   Temp 02/17/22 1059 98.7 ?F (37.1 ?C)  ?   Temp Source 02/17/22 1059 Oral  ?   SpO2 02/17/22 1059 96 %  ?   Weight --   ?   Height --   ?   Head Circumference --   ?   Peak Flow --   ?   Pain Score 02/17/22 1055 10  ?   Pain Loc --   ?   Pain Edu? --   ?   Excl. in GC? --   ? ?No data found. ? ?Updated Vital Signs ?BP 118/85 (BP Location: Right Arm)   Pulse 81   Temp 98.7 ?F (37.1 ?C) (Oral)   Resp 20   SpO2 96%  ? ?Visual Acuity ?Right Eye Distance:   ?Left Eye Distance:   ?Bilateral Distance:   ? ?Right Eye Near:   ?Left Eye Near:    ?Bilateral Near:    ? ?Physical Exam ?Vitals and nursing note reviewed.  ?Constitutional:   ?   Appearance: Normal appearance.  ?HENT:  ?   Head: Atraumatic.  ?   Right Ear: Tympanic membrane and external ear normal.  ?   Left Ear: External ear normal.  ?   Ears:  ?   Comments: Left TM erythematous, edematous ?   Nose: Nose normal.  ?   Mouth/Throat:  ?   Mouth: Mucous membranes are moist.  ?   Pharynx: Oropharyngeal exudate and posterior oropharyngeal erythema present.  ?   Comments: Significant tonsillar erythema, mild tonsillar edema bilaterally.  Scant exudates.  Oral airway patent, uvula midline ?Eyes:  ?   Extraocular Movements: Extraocular movements intact.  ?   Conjunctiva/sclera: Conjunctivae normal.  ?Cardiovascular:  ?   Rate and Rhythm: Normal rate and regular rhythm.  ?  Heart sounds: Normal heart sounds.  ?Pulmonary:  ?   Effort: Pulmonary effort is  normal.  ?   Breath sounds: Normal breath sounds. No wheezing.  ?Musculoskeletal:     ?   General: Normal range of motion.  ?   Cervical back: Normal range of motion and neck supple.  ?Lymphadenopathy:  ?   Cervical: Cervical adenopathy present.  ?Skin: ?   General: Skin is warm and dry.  ?Neurological:  ?   Mental Status: She is alert and oriented to person, place, and time.  ?Psychiatric:     ?   Mood and Affect: Mood normal.     ?   Thought Content: Thought content normal.  ? ? ? ?UC Treatments / Results  ?Labs ?(all labs ordered are listed, but only abnormal results are displayed) ?Labs Reviewed  ?CULTURE, GROUP A STREP [facility]  ?POCT RAPID STREP A (OFFICE)  ? ? ?EKG ? ? ?Radiology ?No results found. ? ?Procedures ?Procedures (including critical care time) ? ?Medications Ordered in UC ?Medications - No data to display ? ?Initial Impression / Assessment and Plan / UC Course  ?I have reviewed the triage vital signs and the nursing notes. ? ?Pertinent labs & imaging results that were available during my care of the patient were reviewed by me and considered in my medical decision making (see chart for details). ? ?  ? ?Vitals reassuring and rapid strep negative, however given exam findings, close exposure will cover for strep and ear infection with amoxicillin, viscous lidocaine, supportive over-the-counter medications and home care.  Throat culture pending.  Work note given. ? ?Final Clinical Impressions(s) / UC Diagnoses  ? ?Final diagnoses:  ?Exposure to strep throat  ?Acute tonsillitis, unspecified etiology  ?Acute suppurative otitis media of left ear without spontaneous rupture of tympanic membrane, recurrence not specified  ? ?Discharge Instructions   ?None ?  ? ?ED Prescriptions   ? ? Medication Sig Dispense Auth. Provider  ? amoxicillin (AMOXIL) 875 MG tablet Take 1 tablet (875 mg total) by mouth 2 (two) times daily. 20 tablet Particia NearingLane, Alexandr Oehler [place], [place]PA-C  ? lidocaine (XYLOCAINE) 2 % solution Use as directed  10 mLs in the mouth or throat every 3 (three) hours as needed for mouth pain. 100 mL Particia NearingLane, Kayd Launer [place], [place]-C  ? ?  ? ?PDMP not reviewed this encounter. ?  ?Particia NearingLane, Zachory Mangual [place], PA-C ?02/17/22 1150 ? ?

## 2022-02-19 LAB — CULTURE, GROUP A STREP (THRC)

## 2022-02-20 ENCOUNTER — Other Ambulatory Visit: Payer: Self-pay

## 2022-02-20 ENCOUNTER — Telehealth: Payer: Self-pay

## 2022-02-20 ENCOUNTER — Encounter: Payer: Self-pay | Admitting: Family Medicine

## 2022-02-20 MED ORDER — WEGOVY 1.7 MG/0.75ML ~~LOC~~ SOAJ: 1.7000 mg | SUBCUTANEOUS | 1 refills | Status: DC

## 2022-02-20 NOTE — Telephone Encounter (Signed)
Rx sent to new pharmacy per pt request.

## 2022-02-20 NOTE — Telephone Encounter (Signed)
Brittany [name] (Key: [id number]) ?Rx #: [id number] ?Wegovy 1.7MG /0.75ML auto-injectors ? ?

## 2022-03-04 NOTE — Telephone Encounter (Signed)
Additional Information forms ? ?Put upfront to be fax back to insurance  ?

## 2022-03-05 ENCOUNTER — Encounter: Payer: Self-pay | Admitting: Family Medicine

## 2022-03-06 NOTE — Telephone Encounter (Signed)
Brittany [name] has been approved.  ? ? ?The request has been approved. The authorization is effective for a maximum of 7 fills from 03/06/2022 to 10/02/2022, as long as the member is enrolled in their current health plan. The request was approved as submitted. The request was approved for 61mL per 28 days.Additional prior authorizations (PA) have been entered [id number] 0.25mg /0.54mL allowing a maximum of 7 fills with a quantity limit of 87mL per 28 days ([place] 17039)Wegovy 1mg /0.37mL allowing a maximum of 7 fills with a quantity limit of 6mL per 28 days ([place] 17040)Wegovy 1.7mg /0.73mL allowing a maximum of 7 fills with a quantity limit of 100mL per 28 days ([place] 17041)Wegovy 2.4mg /0.64mL allowing a maximum of 7 fills with a quantity limit of 77mL per 28 days ([place] 17042)These authorizations are effective 03/06/2022 through 10/02/2022. A written notification letter will follow with additional details. ?

## 2022-03-13 ENCOUNTER — Telehealth: Payer: Self-pay

## 2022-03-13 NOTE — Telephone Encounter (Signed)
Pt called today request for a return call from you please.  ?

## 2022-03-14 NOTE — Telephone Encounter (Signed)
Spoke with pt, she just wanted to ask if you know why her insurance denied her for wegovy. ? ?Explain to her just the plan of insurance that he she has. Suggest for pt to call her insurance and ask them why.  ? ?Per pt voice understanding.  ?

## 2022-03-20 ENCOUNTER — Encounter: Payer: Self-pay | Admitting: Family Medicine

## 2022-04-11 ENCOUNTER — Ambulatory Visit: Payer: Managed Care, Other (non HMO) | Admitting: Family Medicine

## 2022-04-11 VITALS — BP 152/108 | HR 58 | Temp 98.7°F | Ht 62.0 in | Wt 265.6 lb

## 2022-04-11 DIAGNOSIS — G4710 Hypersomnia, unspecified: Secondary | ICD-10-CM | POA: Diagnosis not present

## 2022-04-11 DIAGNOSIS — F411 Generalized anxiety disorder: Secondary | ICD-10-CM | POA: Diagnosis not present

## 2022-04-11 MED ORDER — VENLAFAXINE HCL ER 75 MG PO CP24: 75.0000 mg | ORAL_CAPSULE | Freq: Every day | ORAL | 1 refills | Status: DC

## 2022-04-11 MED ORDER — AMLODIPINE BESYLATE 10 MG PO TABS: 10.0000 mg | ORAL_TABLET | Freq: Every day | ORAL | 3 refills | Status: DC

## 2022-04-11 NOTE — Progress Notes (Signed)
Subjective:    [name], female    DOB: Jul 02, 1991, 31 y.o.   MRN: [id number]  HPI Patient presents today requesting a change on her anxiety medication.  She has been on Lexapro for more than 5 years.  She states that she feels like she has become "immune" to the medication.  She reports generalized feelings of anxiety without specific cause.  She denies any panic attacks.  She reports feeling anxious and worried for no reason however.  She denies severe depression or anhedonia or suicidal thoughts although she does report mild depression at times.  I am also concerned about her blood pressure which is extremely high despite taking amlodipine 5 mg a day.  She does report that she may have sleep apnea.  She states that she feels sleepy all the time.  No matter how much she sleeps she feels like she can always fall asleep.  Her bed partner states that she snores and has heard her making gasping noises in her sleep. Past Medical History:  Diagnosis Date   Contraceptive management 03/04/2013   HSV-1 infection    Hypertension    Medical history non-contributory    Obesity    Pregnant 03/26/2016   Past Surgical History:  Procedure Laterality Date   adnoids  1995   CESAREAN SECTION N/A 10/06/2016   Procedure: CESAREAN SECTION;  Surgeon: Osborne Oman, MD;  Location: [place];  Service: Obstetrics;  Laterality: N/A;   CHOLECYSTECTOMY  04/2017   WISDOM TOOTH EXTRACTION     Current Outpatient Medications on File Prior to Visit  Medication Sig Dispense Refill   escitalopram (LEXAPRO) 10 MG tablet TAKE 1 TABLET(10 MG) BY MOUTH DAILY 90 tablet 1   metroNIDAZOLE (FLAGYL) 500 MG tablet Take 1 tablet (500 mg total) by mouth 2 (two) times daily. 14 tablet 1   norethindrone (MICRONOR) 0.35 MG tablet Take 1 tablet (0.35 mg total) by mouth daily. 84 tablet 4   Semaglutide, 1 MG/DOSE, 4 MG/3ML SOPN Inject 1 mg as directed once a week. 9 mL 1   valACYclovir (VALTREX) 1000 MG  tablet TAKE 2 TABLETS BY MOUTH EVERY 12 HOURS FOR 2 DOSES 4 tablet 3   No current facility-administered medications on file prior to visit.   No Known Allergies Social History   Socioeconomic History   Marital status: Significant Other    Spouse name: Not on file   Number of children: Not on file   Years of education: Not on file   Highest education level: Not on file  Occupational History   Not on file  Tobacco Use   Smoking status: Former    Types: Cigarettes    Quit date: 01/14/2013    Years since quitting: 9.2   Smokeless tobacco: Never  Vaping Use   Vaping Use: Some days   Substances: Nicotine, Flavoring  Substance and Sexual Activity   Alcohol use: Yes    Comment: occ   Drug use: No   Sexual activity: Yes    Birth control/protection: Pill  Other Topics Concern   Not on file  Social History Narrative   Not on file   Social Determinants of Health   Financial Resource Strain: Medium Risk   Difficulty of Paying Living Expenses: Somewhat hard  Food Insecurity: No Food Insecurity   Worried About Running Out of Food in the Last Year: Never true   Ran Out of Food in the Last Year: Never true  Transportation Needs: No Transportation  Needs   Lack of Transportation (Medical): No   Lack of Transportation (Non-Medical): No  Physical Activity: Insufficiently Active   Days of Exercise per Week: 2 days   Minutes of Exercise per Session: 30 min  Stress: No Stress Concern Present   Feeling of Stress : Only a little  Social Connections: Moderately Isolated   Frequency of Communication with Friends and Family: Three times a week   Frequency of Social Gatherings with Friends and Family: Twice a week   Attends Religious Services: Never   Marine scientist or Organizations: No   Attends Music therapist: Never   Marital Status: Living with partner  Intimate Partner Violence: Not At Risk   Fear of Current or Ex-Partner: No   Emotionally Abused: No    Physically Abused: No   Sexually Abused: No    Review of Systems  All other systems reviewed and are negative.     Objective:   Physical Exam Vitals reviewed.  Neck:     Thyroid: No thyromegaly.  Cardiovascular:     Rate and Rhythm: Normal rate and regular rhythm.     Heart sounds: Normal heart sounds.  Pulmonary:     Effort: Pulmonary effort is normal. No respiratory distress.     Breath sounds: Normal breath sounds. No wheezing or rales.  Abdominal:     General: Bowel sounds are normal.     Palpations: Abdomen is soft.  Lymphadenopathy:     Cervical: No cervical adenopathy.          Assessment & Plan:  Hypersomnolence - Plan: Home sleep test  GAD (generalized anxiety disorder) Given the hypersomnolence, her BMI of 48, and her hypertension, I feel that the patient would benefit from sleep study because I feel that she is at high risk for sleep apnea.  Patient request a home sleep study if possible follow-up if her insurance will cover this.  If not I would recommend a formal referral to the sleep lab.  Her blood pressure is elevated today so I will increase her amlodipine to 10 mg a day and have asked the patient to check her blood pressure more frequently at home and notify me if greater than 140/90.  Lastly we will discontinue Lexapro and replace with venlafaxine extended release 75 mg daily.  I cautioned the patient to monitor her blood pressure closely on this.  Reassess in 4 weeks.

## 2022-04-26 ENCOUNTER — Encounter: Payer: Self-pay | Admitting: Family Medicine

## 2022-05-02 ENCOUNTER — Other Ambulatory Visit: Payer: Self-pay | Admitting: Family Medicine

## 2022-05-02 NOTE — Telephone Encounter (Signed)
Requested medication (s) are due for refill today: yes  Requested medication (s) are on the active medication list: yes  Last refill:  03/13/21 #4/3  Future visit scheduled: no  Notes to clinic:  since this is for only 2 day dose rather than QD dose is it ok to refill?     Requested Prescriptions  Pending Prescriptions Disp Refills   valACYclovir (VALTREX) 1000 MG tablet [Pharmacy Med Name: VALACYCLOVIR 1GM TABLETS] 4 tablet 3    Sig: TAKE 2 TABLETS BY MOUTH EVERY 12 HOURS FOR 2 DOSES     Antimicrobials:  Antiviral Agents - Anti-Herpetic Passed - 05/02/2022  5:47 PM      Passed - Valid encounter within last 12 months    Recent Outpatient Visits           3 weeks ago Hypersomnolence   [facility] Tanya Nones, Priscille Heidelberg, MD   7 months ago Acute cough   [facility] Donita Brooks, MD   8 months ago Elevated blood sugar   [facility] Pickard, Priscille Heidelberg, MD   2 years ago Rhinosinusitis   [facility] Donita Brooks, MD   3 years ago Upper respiratory tract infection, unspecified type   [facility] Danelle Berry, PA-C

## 2022-05-09 ENCOUNTER — Encounter: Payer: Self-pay | Admitting: Family Medicine

## 2022-05-10 ENCOUNTER — Other Ambulatory Visit: Payer: Self-pay | Admitting: Family Medicine

## 2022-05-10 MED ORDER — VALACYCLOVIR HCL 1 G PO TABS: 2000.0000 mg | ORAL_TABLET | Freq: Two times a day (BID) | ORAL | 3 refills | Status: AC

## 2022-05-20 ENCOUNTER — Encounter: Payer: Self-pay | Admitting: Family Medicine

## 2022-05-30 ENCOUNTER — Encounter: Payer: Self-pay | Admitting: Family Medicine

## 2022-05-31 ENCOUNTER — Other Ambulatory Visit: Payer: Self-pay | Admitting: Family Medicine

## 2022-05-31 DIAGNOSIS — E669 Obesity, unspecified: Secondary | ICD-10-CM

## 2022-06-10 ENCOUNTER — Encounter: Payer: Self-pay | Admitting: Family Medicine

## 2022-06-10 ENCOUNTER — Ambulatory Visit
Admission: EM | Admit: 2022-06-10 | Discharge: 2022-06-10 | Disposition: A | Discharge status: Home / Self Care | Payer: Managed Care, Other (non HMO) | Attending: Nurse Practitioner | Admitting: Nurse Practitioner

## 2022-06-10 DIAGNOSIS — Z20818 Contact with and (suspected) exposure to other bacterial communicable diseases: Secondary | ICD-10-CM | POA: Diagnosis not present

## 2022-06-10 DIAGNOSIS — R21 Rash and other nonspecific skin eruption: Secondary | ICD-10-CM

## 2022-06-10 DIAGNOSIS — J029 Acute pharyngitis, unspecified: Secondary | ICD-10-CM

## 2022-06-10 LAB — POCT RAPID STREP A (OFFICE): Rapid Strep A Screen: NEGATIVE

## 2022-06-10 MED ORDER — CEPHALEXIN 500 MG PO CAPS: 500.0000 mg | ORAL_CAPSULE | Freq: Two times a day (BID) | ORAL | 0 refills | Status: AC

## 2022-06-10 NOTE — Discharge Instructions (Addendum)
-   Rapid strep throat test today is negative, however given your examination and recent exposure, I would like to treat you for strep throat.  Please start the Keflex 500 mg twice daily for 10 days - This antibiotic should also take care of a UTI - If UTI symptoms return after antibiotic, follow up with PCP or Urologist - Rash is consistent with hand, foot, and mouth disease.  This is typically self limiting.  If you develop sores/blisters going up your arms, or blisters inside your mouth, go to the ER.

## 2022-06-10 NOTE — ED Provider Notes (Signed)
[facility]    CSN: [id number] Arrival date & time: 06/10/22  1113      History   Chief Complaint Chief Complaint  Patient presents with   Rash    And sore throat - Entered by patient    HPI Brittany [name] is a 31 y.o. female.   Patient presents with rash that started on her hands and 1 spot on her foot since yesterday.  Reports the rash is raised, firm, itchy, and burns.  Denies drainage from the areas.  Denies blisters.    She recently started Ciprofloxacin for UTI symptoms and is concerned the rash may be an allergic reaction.  Reports since starting the Cipro, her urinary symptoms have resolved.  She has not taken Cipro today.    She also has a sore throat that started last week.  Reports the sore throat started before she started the Cipfloaxcin.  Her son was treated for strep throat last week.  She endorses fever of 103 at home over the weekend.  Denies cough, congestion, wheezing, shortness of breath, stuffy nose, abdominal pain, nausea/vomiting.      Past Medical History:  Diagnosis Date   Contraceptive management 03/04/2013   HSV-1 infection    Hypertension    Medical history non-contributory    Obesity    Pregnant 03/26/2016    Patient Active Problem List   Diagnosis Date Noted   Itching in the vaginal area 01/11/2022   Vaginal odor 01/11/2022   Screening examination for STD (sexually transmitted disease) 09/21/2021   Urine pregnancy test negative 06/15/2020   Encounter for gynecological examination with Papanicolaou smear of cervix 06/15/2020   Hypertension 05/31/2019   Encounter for surveillance of contraceptive pills 03/16/2018   Encounter for well woman exam with routine gynecological exam 03/16/2018   Body mass index (BMI) of 45.0-49.9 in adult (HCC) 04/21/2017   Weight loss counseling, encounter for 04/21/2017   History of preterm delivery 11/08/2016   History of cesarean delivery 11/08/2016   History of gestational hypertension  11/08/2016   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 Authorizing Provider  cephALEXin (KEFLEX) 500 MG capsule Take 1 capsule (500 mg total) by mouth 2 (two) times daily for 10 days. 06/10/22 06/20/22 Yes Cathlean Marseilles A, NP  amLODipine (NORVASC) 10 MG tablet Take 1 tablet (10 mg total) by mouth daily. 04/11/22   Donita Brooks, MD  metroNIDAZOLE (FLAGYL) 500 MG tablet Take 1 tablet (500 mg total) by mouth 2 (two) times daily. 01/11/22   Adline Potter, NP  norethindrone (MICRONOR) 0.35 MG tablet Take 1 tablet (0.35 mg total) by mouth  daily. 09/21/21   Adline Potter, NP  Semaglutide, 1 MG/DOSE, 4 MG/3ML SOPN Inject 1 mg as directed once a week. 12/17/21   Donita Brooks, MD  valACYclovir (VALTREX) 1000 MG tablet Take 2 tablets (2,000 mg total) by mouth 2 (two) times daily. 05/10/22   Donita Brooks, MD  venlafaxine XR (EFFEXOR XR) 75 MG 24 hr capsule Take 1 capsule (75 mg total) by mouth daily with breakfast. Stop lexapro 04/11/22   Donita Brooks, MD    Family History Family History  Problem Relation Age of Onset   Cancer Maternal Grandmother        breast   Cancer Paternal Uncle        lung   Cancer Paternal Grandfather    Cancer Father        lung   Cancer Cousin        lung, brain   Cancer Maternal Uncle        colon    Social History Social History   Tobacco Use   Smoking status:  Former    Types: Cigarettes    Quit date: 01/14/2013    Years since quitting: 9.4   Smokeless tobacco: Never  Vaping Use   Vaping Use: Some days   Substances: Nicotine, Flavoring  Substance Use Topics   Alcohol use: Yes    Comment: occ   Drug use: No     Allergies   Patient has no known allergies.   Review of Systems Review of Systems Per HPI  Physical Exam Triage Vital Signs ED Triage Vitals  Enc Vitals Group     BP 06/10/22 1155 (!) 129/93     Pulse Rate 06/10/22 1155 85     Resp 06/10/22 1155 18     Temp 06/10/22 1155 98.7 F (37.1 C)     Temp src --      SpO2 06/10/22 1155 95 %     Weight --      Height --      Head Circumference --      Peak Flow --      Pain Score 06/10/22 1154 0     Pain Loc --      Pain Edu? --      Excl. in GC? --    No data found.  Updated Vital Signs BP (!) 129/93   Pulse 85   Temp 98.7 F (37.1 C)   Resp 18   SpO2 95%   Visual Acuity Right Eye Distance:   Left Eye Distance:   Bilateral Distance:    Right Eye Near:   Left Eye Near:    Bilateral Near:     Physical Exam Vitals and nursing note reviewed.  Constitutional:      General: She is not in acute distress.    Appearance: Normal appearance. She is obese. She is not toxic-appearing.  HENT:     Right Ear: Tympanic membrane, ear canal and external ear normal. There is no impacted cerumen.     Left Ear: Tympanic membrane, ear canal and external ear normal. There is no impacted cerumen.     Nose: Nose normal. No congestion or rhinorrhea.     Mouth/Throat:     Mouth: Mucous membranes are moist.     Pharynx: Oropharynx is clear. Posterior oropharyngeal erythema present.     Tonsils: No tonsillar exudate. 2+ on the right. 2+ on the left.     Comments: Cobblestoning of posterior pharynx Eyes:  General: No scleral icterus.    Extraocular Movements: Extraocular movements intact.  Cardiovascular:     Rate and Rhythm: Normal rate and regular rhythm.  Pulmonary:      Effort: Pulmonary effort is normal. No respiratory distress.     Breath sounds: No wheezing, rhonchi or rales.  Musculoskeletal:     Cervical back: Normal range of motion.  Lymphadenopathy:     Cervical: No cervical adenopathy.  Skin:    Capillary Refill: Capillary refill takes less than 2 seconds.     Findings: Rash present. Rash is macular and papular.     Comments: Maculopapular rash to bilateral hands, soles of feet.  Slightly erythematous, papules are firm to touch.  No fluctuance, warmth.  Neurological:     Mental Status: She is alert and oriented to person, place, and time.  Psychiatric:        Behavior: Behavior is cooperative.      UC Treatments / Results  Labs (all labs ordered are listed, but only abnormal results are displayed) Labs Reviewed  POCT RAPID STREP A (OFFICE)    EKG   Radiology No results found.  Procedures Procedures (including critical care time)  Medications Ordered in UC Medications - No data to display  Initial Impression / Assessment and Plan / UC Course  I have reviewed the triage vital signs and the nursing notes.  Pertinent labs & imaging results that were available during my care of the patient were reviewed by me and considered in my medical decision making (see chart for details).    Patient is a very pleasant, well-appearing 31 year old female presenting with sore throat and rash today.  Rapid strep throat test today is negative, however examination is consistent with strep throat and she has had a known exposure.  I do not appreciate any blisters or sloughing of the oropharynx today.  Stop Ciprofloxacin given concern for possible allergic reaction.  Will treat for strep pharyngitis with Keflex 500 mg twice daily for 10 days which will also cover for a UTI.  Differentials for rash include hand foot mouth disease, contact dermatitis.  Low suspicious for Trudie Buckler syndrome.  Discussed that this will be self limiting if HFMD.  ER  precautions discussed - new blisters in mouth, sloughing of the skin, difficulty breathing, etc.  Note given for work.  The patient was given the opportunity to ask questions.  All questions answered to their satisfaction.  The patient is in agreement to this plan.   Final Clinical Impressions(s) / UC Diagnoses   Final diagnoses:  Acute pharyngitis, unspecified etiology  Exposure to strep throat  Rash and nonspecific skin eruption     Discharge Instructions      - Rapid strep throat test today is negative, however given your examination and recent exposure, I would like to treat you for strep throat.  Please start the Keflex 500 mg twice daily for 10 days - This antibiotic should also take care of a UTI - If UTI symptoms return after antibiotic, follow up with PCP or Urologist - Rash is consistent with hand, foot, and mouth disease.  This is typically self limiting.  If you develop sores/blisters going up your arms, or blisters inside your mouth, go to the ER.     ED Prescriptions     Medication Sig Dispense Auth. Provider   cephALEXin (KEFLEX) 500 MG capsule Take 1 capsule (500 mg total) by mouth 2 (two) times daily for 10 days. 20 capsule Bradly Bienenstock,  Guadlupe Spanish, NP      PDMP not reviewed this encounter.   Valentino Nose, NP 06/10/22 1332

## 2022-06-10 NOTE — ED Triage Notes (Signed)
Pt presents with rash on hands and sore throat that developed after beginning cipro on Friday for uti , pt did not take dose this morning, rash began yesterday

## 2022-06-11 ENCOUNTER — Other Ambulatory Visit: Payer: Self-pay | Admitting: Family Medicine

## 2022-06-11 DIAGNOSIS — G4710 Hypersomnia, unspecified: Secondary | ICD-10-CM

## 2022-06-12 ENCOUNTER — Ambulatory Visit: Payer: Managed Care, Other (non HMO) | Admitting: Adult Health

## 2022-06-25 ENCOUNTER — Other Ambulatory Visit: Payer: Self-pay | Admitting: Family Medicine

## 2022-06-25 NOTE — Telephone Encounter (Signed)
Requested medication (s) are due for refill today: yes  Requested medication (s) are on the active medication list: yes  Last refill:  04/11/22 #30 1 RF  Future visit scheduled: no  Notes to clinic:  overdue lipid panel   Requested Prescriptions  Pending Prescriptions Disp Refills   venlafaxine XR (EFFEXOR-XR) 75 MG 24 hr capsule [Pharmacy Med Name: VENLAFAXINE ER 75MG  CAPSULES] 30 capsule 1    Sig: TAKE 1 CAPSULE(75 MG) BY MOUTH DAILY WITH BREAKFAST. STOP LEXAPRO     Psychiatry: Antidepressants - SNRI - desvenlafaxine & venlafaxine Failed - 06/25/2022  8:56 AM      Failed - Last BP in normal range    BP Readings from Last 1 Encounters:  06/10/22 (!) 129/93         Failed - Lipid Panel in normal range within the last 12 months    Cholesterol, Total  Date Value Ref Range Status  05/09/2015 173 100 - 199 mg/dL Final   LDL Calculated  Date Value Ref Range Status  05/09/2015 78 0 - 99 mg/dL Final   HDL  Date Value Ref Range Status  05/09/2015 45 >39 mg/dL Final    Comment:    According to ATP-III Guidelines, HDL-C >59 mg/dL is considered a negative risk factor for CHD.    Triglycerides  Date Value Ref Range Status  05/09/2015 252 (H) 0 - 149 mg/dL Final         Passed - Cr in normal range and within 360 days    Creat  Date Value Ref Range Status  08/14/2021 0.94 0.50 - 0.97 mg/dL Final   Creatinine, Urine  Date Value Ref Range Status  09/25/2016 50.00 mg/dL Final         Passed - Valid encounter within last 6 months    Recent Outpatient Visits           2 months ago Hypersomnolence   [facility], [place]   9 months ago Acute cough   [facility], [facility]   10 months ago Elevated blood sugar   [facility] [place], [facility], [place]   2 years ago Rhinosinusitis   [facility], [place]   3 years ago Upper respiratory tract infection, unspecified type   [facility], [facility]

## 2022-07-29 ENCOUNTER — Emergency Department (HOSPITAL_COMMUNITY)
Admission: EM | Admit: 2022-07-29 | Discharge: 2022-07-29 | Disposition: A | Discharge status: Home / Self Care | Payer: Managed Care, Other (non HMO) | Attending: Emergency Medicine | Admitting: Emergency Medicine

## 2022-07-29 ENCOUNTER — Other Ambulatory Visit: Payer: Self-pay

## 2022-07-29 ENCOUNTER — Other Ambulatory Visit: Payer: Self-pay | Admitting: Family Medicine

## 2022-07-29 ENCOUNTER — Encounter (HOSPITAL_COMMUNITY): Payer: Self-pay | Admitting: Emergency Medicine

## 2022-07-29 ENCOUNTER — Encounter: Payer: Self-pay | Admitting: Family Medicine

## 2022-07-29 DIAGNOSIS — M7918 Myalgia, other site: Secondary | ICD-10-CM | POA: Insufficient documentation

## 2022-07-29 DIAGNOSIS — I10 Essential (primary) hypertension: Secondary | ICD-10-CM | POA: Diagnosis not present

## 2022-07-29 DIAGNOSIS — M79662 Pain in left lower leg: Secondary | ICD-10-CM | POA: Diagnosis not present

## 2022-07-29 DIAGNOSIS — E871 Hypo-osmolality and hyponatremia: Secondary | ICD-10-CM | POA: Diagnosis not present

## 2022-07-29 DIAGNOSIS — M791 Myalgia, unspecified site: Secondary | ICD-10-CM

## 2022-07-29 DIAGNOSIS — M79661 Pain in right lower leg: Secondary | ICD-10-CM | POA: Insufficient documentation

## 2022-07-29 DIAGNOSIS — Z79899 Other long term (current) drug therapy: Secondary | ICD-10-CM | POA: Diagnosis not present

## 2022-07-29 DIAGNOSIS — M79604 Pain in right leg: Secondary | ICD-10-CM

## 2022-07-29 DIAGNOSIS — E876 Hypokalemia: Secondary | ICD-10-CM | POA: Diagnosis not present

## 2022-07-29 LAB — CBC WITH DIFFERENTIAL/PLATELET
Abs Immature Granulocytes: 0.05 10*3/uL (ref 0.00–0.07)
Basophils Absolute: 0.1 10*3/uL (ref 0.0–0.1)
Basophils Relative: 1 %
Eosinophils Absolute: 0.2 10*3/uL (ref 0.0–0.5)
Eosinophils Relative: 2 %
HCT: 39.1 % (ref 36.0–46.0)
Hemoglobin: 12.7 g/dL (ref 12.0–15.0)
Immature Granulocytes: 0 %
Lymphocytes Relative: 17 %
Lymphs Abs: 2 10*3/uL (ref 0.7–4.0)
MCH: 29.7 pg (ref 26.0–34.0)
MCHC: 32.5 g/dL (ref 30.0–36.0)
MCV: 91.4 fL (ref 80.0–100.0)
Monocytes Absolute: 0.7 10*3/uL (ref 0.1–1.0)
Monocytes Relative: 6 %
Neutro Abs: 8.5 10*3/uL — ABNORMAL HIGH (ref 1.7–7.7)
Neutrophils Relative %: 74 %
Platelets: 485 10*3/uL — ABNORMAL HIGH (ref 150–400)
RBC: 4.28 MIL/uL (ref 3.87–5.11)
RDW: 12.7 % (ref 11.5–15.5)
WBC: 11.5 10*3/uL — ABNORMAL HIGH (ref 4.0–10.5)
nRBC: 0 % (ref 0.0–0.2)

## 2022-07-29 LAB — BASIC METABOLIC PANEL
Anion gap: 8 (ref 5–15)
BUN: 12 mg/dL (ref 6–20)
CO2: 28 mmol/L (ref 22–32)
Calcium: 8.6 mg/dL — ABNORMAL LOW (ref 8.9–10.3)
Chloride: 97 mmol/L — ABNORMAL LOW (ref 98–111)
Creatinine, Ser: 0.8 mg/dL (ref 0.44–1.00)
GFR, Estimated: >60 mL/min (ref >60)
Glucose, Bld: 98 mg/dL (ref 70–99)
Potassium: 3.2 mmol/L — ABNORMAL LOW (ref 3.5–5.1)
Sodium: 133 mmol/L — ABNORMAL LOW (ref 135–145)

## 2022-07-29 LAB — CK: Total CK: 67 U/L (ref 38–234)

## 2022-07-29 MED ORDER — KETOROLAC TROMETHAMINE 30 MG/ML IJ SOLN
30.0000 mg | Freq: Once | INTRAMUSCULAR | Status: AC
  Administered 2022-07-29: 30 mg via INTRAVENOUS
  Filled 2022-07-29: qty 1

## 2022-07-29 MED ORDER — SODIUM CHLORIDE 0.9 % IV BOLUS (SEPSIS)
1000.0000 mL | Freq: Once | INTRAVENOUS | Status: AC
Start: 2022-07-29 — End: 2022-07-29
  Administered 2022-07-29: 1000 mL via INTRAVENOUS

## 2022-07-29 MED ORDER — POTASSIUM CHLORIDE CRYS ER 20 MEQ PO TBCR
40.0000 meq | EXTENDED_RELEASE_TABLET | Freq: Once | ORAL | Status: AC
  Administered 2022-07-29: 40 meq via ORAL
  Filled 2022-07-29: qty 2

## 2022-07-29 NOTE — Discharge Instructions (Addendum)
You were seen in the emergency department today for myalgias or muscle aches.  Your labs were remarkable for some mild low potassium levels.  This can contribute to muscle aches and pains as well as your new medications that you started using.  Make sure to keep yourself hydrated and take Tylenol and ibuprofen as needed for aches or pain.  Make sure to follow-up with your primary care physician regarding your visit to the ER today and repeat potassium check in 1 to 2 weeks.  Come back for any severe worsening pain, injuries, loss of sensation or weakness, or any other symptoms concerning to you.

## 2022-07-29 NOTE — ED Provider Notes (Signed)
[facility] EMERGENCY DEPARTMENT Provider Note   CSN: [id number] Arrival date & time: 07/29/22  8295     History  Chief Complaint  Patient presents with   Leg Pain    Brittany [name] is a 31 y.o. female.  With PMH of obesity, HTN who presents with bilateral lower extremity pain x 2-3 weeks that she believes is due to new medications that she started taking which includes metformin and phentermine.  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  She has had increasing pain and aches throughout her calves on bilateral sides and ankles and knees.  She also notes some mild swelling of the knees and ankles at work which have since gone down.  She has had no redness, fevers, chills, skin changes.  She has had no injuries or falls.  No numbness or tingling, loss of sensation or weakness.  She has had some decreased p.o. intake some mild weight loss and polyuria since taking phentermine and metformin.  She takes ibuprofen with improvement of pain.   Leg Pain      Home Medications Prior to Admission medications   Medication Sig Start Date End Date Taking? Authorizing Provider  amLODipine (NORVASC) 10 MG tablet Take 1 tablet (10 mg total) by mouth daily. 04/11/22  Yes Donita Brooks, MD  metFORMIN (GLUCOPHAGE-XR) 500 MG 24 hr tablet Take 500 mg by mouth daily. 06/19/22  Yes [provider]  norethindrone (MICRONOR) 0.35 MG tablet Take 1 tablet (0.35 mg total) by mouth daily. 09/21/21  Yes Cyril Mourning A, NP  phentermine (ADIPEX-P) 37.5 MG tablet Take 37.5 mg by mouth daily. 07/12/22  Yes [provider]  venlafaxine XR (EFFEXOR-XR) 75 MG 24 hr capsule TAKE 1 CAPSULE(75 MG) BY MOUTH DAILY WITH BREAKFAST. STOP LEXAPRO 06/25/22  Yes Donita Brooks, MD  metroNIDAZOLE (FLAGYL) 500 MG tablet Take 1 tablet (500 mg total) by mouth 2 (two) times daily. Patient not taking: Reported on 07/29/2022 01/11/22   Adline Potter, NP  Semaglutide, 1  MG/DOSE, 4 MG/3ML SOPN Inject 1 mg as directed once a week. Patient not taking: Reported on 07/29/2022 12/17/21   Donita Brooks, MD  valACYclovir (VALTREX) 1000 MG tablet Take 2 tablets (2,000 mg total) by mouth 2 (two) times daily. Patient not taking: Reported on 07/29/2022 05/10/22   Donita Brooks, MD      Allergies    Ciprofloxacin    Review of Systems   Review of Systems  Physical Exam Updated Vital Signs BP 122/85 (BP Location: Right Arm)   Pulse 78   Temp 98.4 F (36.9 C) (Oral)   Resp 18   Ht 5\' 2"  (1.575 m)   Wt 117.9 kg   LMP 07/20/2022   SpO2 95%   BMI 47.55 kg/m  Physical Exam Constitutional: Alert and oriented. Well appearing and in no distress. Eyes: Conjunctivae are normal. ENT      Head: Normocephalic and atraumatic.      Nose: No congestion.      Mouth/Throat: Mucous membranes are moist.      Neck: No stridor. Cardiovascular: S1, S2,  Normal and symmetric distal pulses are present in all extremities.Warm and well perfused. Respiratory: Normal respiratory effort. Gastrointestinal: Soft  Musculoskeletal: Normal range of motion in all extremities. 5 out of 5 strength bilateral hip flexors, knee flexors and extensors, foot dorsiflexion and plantarflexion.  Sensation grossly intact of bilateral lower extremities.  Equal palpable bilateral DP pulses  warm well perfused.  No erythema or warmth or skin changes.  No effusions of the joints. Neurologic: Normal speech and language. No gross focal neurologic deficits are appreciated. Skin: Skin is warm, dry and intact. No rash noted. Psychiatric: Mood and affect are normal. Speech and behavior are normal.  ED Results / Procedures / Treatments   Labs (all labs ordered are listed, but only abnormal results are displayed) Labs Reviewed  BASIC METABOLIC PANEL - Abnormal; Notable for the following components:      Result Value   Sodium 133 (*)    Potassium 3.2 (*)    Chloride 97 (*)    Calcium 8.6 (*)    All other  components within normal limits  CBC WITH DIFFERENTIAL/PLATELET - Abnormal; Notable for the following components:   WBC 11.5 (*)    Platelets 485 (*)    Neutro Abs 8.5 (*)    All other components within normal limits  CK    EKG None  Radiology No results found.  Procedures Procedures    Medications Ordered in ED Medications  sodium chloride 0.9 % bolus 1,000 mL (has no administration in time range)  potassium chloride SA (KLOR-CON M) CR tablet 40 mEq (has no administration in time range)  ketorolac (TORADOL) 30 MG/ML injection 30 mg (30 mg Intravenous Given 07/29/22 2355)    ED Course/ Medical Decision Making/ A&P                           Medical Decision Making Brittany [name] is a 31 y.o. female.  With PMH of obesity, HTN who presents with bilateral lower extremity pain x 2-3 weeks that she believes is due to new medications that she started taking which includes metformin and phentermine.   Patient has nonspecific myalgias ongoing for the past 2 to 3 weeks of bilateral lower extremities.  Suspect likely related to new medication use or possible hypokalemia as determined on her lab results.  She had mild hypokalemia 3.2 with mild hyponatremia 133 and hypochloremia 97 likely due to decreased p.o. intake and fluid losses from medication use.  She has no skin changes, no concern for cellulitis.  No pitting edema or findings concerning for DVT.  She has had no injuries or falls and is ambulatory, no concern for fracture or dislocation.  She is neurovascularly intact in both limbs, no concern for ischemic limb.  Her CK was within normal limits, no evidence of rhabdomyolysis.  Patient had improvement of pain with Toradol.  Also gave patient normal saline bolus and potassium repletion.  Advised her to continue ibuprofen at home and follow-up with PCP for repeat potassium check.  Discussed tricked return precautions.  Safe for discharge home.  Amount and/or Complexity of Data  Reviewed Labs: ordered.  Risk Prescription drug management.    Final Clinical Impression(s) / ED Diagnoses Final diagnoses:  Pain in both lower extremities  Hypokalemia  Myalgia    Rx / DC Orders ED Discharge Orders     None         Elgie Congo, MD 07/29/22 [phone]

## 2022-07-29 NOTE — ED Triage Notes (Signed)
Pt with c/o bilateral leg pain x 2 weeks. States she thinks it could be d/t new meds (Metformin and Phentarimine)she has started taking.

## 2022-07-30 ENCOUNTER — Telehealth: Payer: Self-pay

## 2022-07-30 NOTE — Telephone Encounter (Signed)
Transition Care Management Unsuccessful Follow-up Telephone Call  Date of discharge and from where:  07/29/22 from [facility]  Attempts:  1st Attempt  Reason for unsuccessful TCM follow-up call:  Left voice message    

## 2022-07-31 ENCOUNTER — Telehealth: Payer: Self-pay

## 2022-07-31 NOTE — Telephone Encounter (Signed)
Transition Care Management Follow-up Telephone Call Date of discharge and from where: 07/29/2022. [facility] How have you been since you were released from the hospital? Pt states she is still having issues with pain. Any questions or concerns? No  Items Reviewed: Did the pt receive and understand the discharge instructions provided? Yes  Medications obtained and verified? Yes  Other? No  Any new allergies since your discharge? No  Dietary orders reviewed? No Do you have support at home? Yes   Home Care and Equipment/Supplies: Were home health services ordered? not applicable If so, what is the name of the agency? N/A  Has the agency set up a time to come to the patient's home? not applicable Were any new equipment or medical supplies ordered?  No What is the name of the medical supply agency? N/A Were you able to get the supplies/equipment? not applicable Do you have any questions related to the use of the equipment or supplies? No  Functional Questionnaire: (I = Independent and D = Dependent) ADLs: I  Bathing/Dressing- I  Meal Prep- I  Eating- I  Maintaining continence- I  Transferring/Ambulation- I  Managing Meds- I  Follow up appointments reviewed:  PCP Hospital f/u appt confirmed? Yes  Scheduled to see [facility]  on 9/26 @ 11:15. [place] [facility] f/u appt confirmed? No  Scheduled to see N/A. Are transportation arrangements needed? No  If their condition worsens, is the pt aware to call PCP or go to the Emergency Dept.? Yes Was the patient provided with contact information for the PCP's office or ED? Yes Was to pt encouraged to call back with questions or concerns? Yes

## 2022-08-06 ENCOUNTER — Ambulatory Visit (HOSPITAL_COMMUNITY)
Admission: RE | Admit: 2022-08-06 | Discharge: 2022-08-06 | Disposition: A | Discharge status: Home / Self Care | Payer: Managed Care, Other (non HMO) | Source: Ambulatory Visit | Attending: Family Medicine | Admitting: Family Medicine

## 2022-08-06 ENCOUNTER — Ambulatory Visit: Payer: Managed Care, Other (non HMO) | Admitting: Family Medicine

## 2022-08-06 VITALS — BP 132/90 | HR 81 | Temp 98.2°F | Ht 62.0 in | Wt 268.4 lb

## 2022-08-06 DIAGNOSIS — M25562 Pain in left knee: Secondary | ICD-10-CM

## 2022-08-06 DIAGNOSIS — E876 Hypokalemia: Secondary | ICD-10-CM | POA: Diagnosis not present

## 2022-08-06 DIAGNOSIS — M25561 Pain in right knee: Secondary | ICD-10-CM | POA: Insufficient documentation

## 2022-08-06 NOTE — Progress Notes (Signed)
Subjective:    [name], female    DOB: [birth year], 31 y.o.   MRN: [id number]  HPI Patient reports bilateral leg pain.  The pain is located below both knees right greater than left.  She has no pain with flexion and extension of the knee passively however when she stands up she has pain below the kneecap and inside the knee joint right greater than left.  The pain radiates down her shins in a nonspecific fashion.  Essentially her legs ache and throb from the knees down when she is on her legs for long period of time.  There is no unilateral leg swelling to suggest DVT.  There is no erythema or warmth.  She does have some small varicose veins but not sufficient enough I feel to cause significant leg pain.  There is no evidence of cellulitis.  She has excellent dorsalis pedis pulses and posterior tibialis pulses bilaterally.  She has normal range of motion in both hips and both ankles without pain however she does have pain with valgus and varus stress in both knees.  She has a negative anterior and posterior drawer sign but she does have some tenderness with patellar grind.  I suspect that this is most likely musculoskeletal knee pain exacerbated by elevated BMI and working a job that requires her to be on her feet for many hours on hard floors Past Medical History:  Diagnosis Date   Contraceptive management 03/04/2013   HSV-1 infection    Hypertension    Medical history non-contributory    Obesity    Pregnant 03/26/2016   Past Surgical History:  Procedure Laterality Date   adnoids  1995   CESAREAN SECTION N/A 10/06/2016   Procedure: CESAREAN SECTION;  Surgeon: Tereso Newcomer, MD;  Location: [facility];  Service: Obstetrics;  Laterality: N/A;   CHOLECYSTECTOMY  04/2017   WISDOM TOOTH EXTRACTION     Current Outpatient Medications on File Prior to Visit  Medication Sig Dispense Refill   amLODipine (NORVASC) 10 MG tablet Take 1 tablet (10 mg total) by mouth daily. 90  tablet 3   metFORMIN (GLUCOPHAGE-XR) 500 MG 24 hr tablet Take 500 mg by mouth daily.     metroNIDAZOLE (FLAGYL) 500 MG tablet Take 1 tablet (500 mg total) by mouth 2 (two) times daily. (Patient not taking: Reported on 07/29/2022) 14 tablet 1   norethindrone (MICRONOR) 0.35 MG tablet Take 1 tablet (0.35 mg total) by mouth daily. 84 tablet 4   phentermine (ADIPEX-P) 37.5 MG tablet Take 37.5 mg by mouth daily.     Semaglutide, 1 MG/DOSE, 4 MG/3ML SOPN Inject 1 mg as directed once a week. (Patient not taking: Reported on 07/29/2022) 9 mL 1   valACYclovir (VALTREX) 1000 MG tablet Take 2 tablets (2,000 mg total) by mouth 2 (two) times daily. (Patient not taking: Reported on 07/29/2022) 4 tablet 3   venlafaxine XR (EFFEXOR-XR) 75 MG 24 hr capsule TAKE 1 CAPSULE(75 MG) BY MOUTH DAILY WITH BREAKFAST. STOP LEXAPRO 30 capsule 3   No current facility-administered medications on file prior to visit.   Allergies  Allergen Reactions   Ciprofloxacin Rash   Social History   Socioeconomic History   Marital status: Significant Other    Spouse name: Not on file   Number of children: Not on file   Years of education: Not on file   Highest education level: Not on file  Occupational History   Not on file  Tobacco Use  Smoking status: Former    Types: Cigarettes    Quit date: 01/14/2013    Years since quitting: 9.5   Smokeless tobacco: Never  Vaping Use   Vaping Use: Some days   Substances: Nicotine, Flavoring  Substance and Sexual Activity   Alcohol use: Yes    Comment: occ   Drug use: No   Sexual activity: Yes    Birth control/protection: Pill  Other Topics Concern   Not on file  Social History Narrative   Not on file   Social Determinants of Health   Financial Resource Strain: Medium Risk (09/21/2021)   Overall Financial Resource Strain (CARDIA)    Difficulty of Paying Living Expenses: Somewhat hard  Food Insecurity: No Food Insecurity (09/21/2021)   Hunger Vital Sign    Worried About  Running Out of Food in the Last Year: Never true    Ran Out of Food in the Last Year: Never true  Transportation Needs: No Transportation Needs (09/21/2021)   PRAPARE - Hydrologist (Medical): No    Lack of Transportation (Non-Medical): No  Physical Activity: Insufficiently Active (09/21/2021)   Exercise Vital Sign    Days of Exercise per Week: 2 days    Minutes of Exercise per Session: 30 min  Stress: No Stress Concern Present (09/21/2021)   [place]    Feeling of Stress : Only a little  Social Connections: Moderately Isolated (09/21/2021)   Social Connection and Isolation Panel [NHANES]    Frequency of Communication with Friends and Family: Three times a week    Frequency of Social Gatherings with Friends and Family: Twice a week    Attends Religious Services: Never    Marine scientist or Organizations: No    Attends Archivist Meetings: Never    Marital Status: Living with partner  Intimate Partner Violence: Not At Risk (09/21/2021)   Humiliation, Afraid, Rape, and Kick questionnaire    Fear of Current or Ex-Partner: No    Emotionally Abused: No    Physically Abused: No    Sexually Abused: No    Review of Systems  All other systems reviewed and are negative.      Objective:   Physical Exam Vitals reviewed.  Neck:     Thyroid: No thyromegaly.  Cardiovascular:     Rate and Rhythm: Normal rate and regular rhythm.     Heart sounds: Normal heart sounds.  Pulmonary:     Effort: Pulmonary effort is normal. No respiratory distress.     Breath sounds: Normal breath sounds. No wheezing or rales.  Abdominal:     General: Bowel sounds are normal.     Palpations: Abdomen is soft.  Musculoskeletal:     Right hip: No tenderness or bony tenderness. Normal range of motion.     Left hip: No tenderness or bony tenderness. Normal range of motion.     Right knee: No  crepitus. Normal range of motion. Tenderness present over the medial joint line and patellar tendon.     Instability Tests: Anterior drawer test negative. Posterior drawer test negative.     Left knee: No crepitus. Normal range of motion. Tenderness present over the medial joint line and patellar tendon.     Instability Tests: Anterior drawer test negative. Posterior drawer test negative.     Right lower leg: No bony tenderness. No edema.     Left lower leg: No bony tenderness. No edema.  Right ankle: No tenderness. Normal range of motion. Anterior drawer test negative. Normal pulse.     Left ankle: No tenderness. Normal range of motion. Anterior drawer test negative. Normal pulse.  Lymphadenopathy:     Cervical: No cervical adenopathy.           Assessment & Plan:  Hypokalemia - Plan: CBC with Differential/Platelet, BASIC METABOLIC PANEL WITH GFR  Acute pain of both knees - Plan: DG Knee Complete 4 Views Right, DG Knee Complete 4 Views Left Patient did have hypokalemia in the emergency room.  I will repeat a potassium level to ensure that this was not the cause of the patient's leg pain however I do not suspect hypokalemia would cause pain but rather cramps.  The sound more like musculoskeletal knee pain.  Therefore we will begin by obtaining x-rays of the left and right knee to evaluate further.  I see no evidence of arterial insufficiency or peripheral vascular disease.  She has a negative Homans' sign and no significant edema and the pain is bilateral so I do not feel that she has a DVT.  The history does not support neuropathy or restless leg syndrome

## 2022-08-07 LAB — CBC WITH DIFFERENTIAL/PLATELET
Absolute Monocytes: 737 cells/uL (ref 200–950)
Basophils Absolute: 59 cells/uL (ref 0–200)
Basophils Relative: 0.5 %
Eosinophils Absolute: 164 cells/uL (ref 15–500)
Eosinophils Relative: 1.4 %
HCT: 38.9 % (ref 35.0–45.0)
Hemoglobin: 12.6 g/dL (ref 11.7–15.5)
Lymphs Abs: 2714 cells/uL (ref 850–3900)
MCH: 29.4 pg (ref 27.0–33.0)
MCHC: 32.4 g/dL (ref 32.0–36.0)
MCV: 90.7 fL (ref 80.0–100.0)
MPV: 9.5 fL (ref 7.5–12.5)
Monocytes Relative: 6.3 %
Neutro Abs: 8026 cells/uL — ABNORMAL HIGH (ref 1500–7800)
Neutrophils Relative %: 68.6 %
Platelets: 527 10*3/uL — ABNORMAL HIGH (ref 140–400)
RBC: 4.29 10*6/uL (ref 3.80–5.10)
RDW: 12.1 % (ref 11.0–15.0)
Total Lymphocyte: 23.2 %
WBC: 11.7 10*3/uL — ABNORMAL HIGH (ref 3.8–10.8)

## 2022-08-07 LAB — BASIC METABOLIC PANEL WITH GFR
BUN: 15 mg/dL (ref 7–25)
CO2: 24 mmol/L (ref 20–32)
Calcium: 9 mg/dL (ref 8.6–10.2)
Chloride: 100 mmol/L (ref 98–110)
Creat: 0.91 mg/dL (ref 0.50–0.97)
Glucose, Bld: 79 mg/dL (ref 65–99)
Potassium: 4.8 mmol/L (ref 3.5–5.3)
Sodium: 135 mmol/L (ref 135–146)
eGFR: 86 mL/min/{1.73_m2} (ref >=60)

## 2022-08-08 ENCOUNTER — Other Ambulatory Visit: Payer: Self-pay

## 2022-08-08 DIAGNOSIS — M79606 Pain in leg, unspecified: Secondary | ICD-10-CM

## 2022-08-08 MED ORDER — MELOXICAM 15 MG PO TABS: 15.0000 mg | ORAL_TABLET | Freq: Every day | ORAL | 0 refills | Status: DC

## 2022-08-09 ENCOUNTER — Telehealth: Payer: Self-pay

## 2022-08-09 NOTE — Telephone Encounter (Signed)
Pt called, stating she received message regarding Meloxicam. Pt asks if she could get an rx for a fluid pill? Pt states she is still having some issues with her legs swelling. Thank you.

## 2022-08-13 ENCOUNTER — Encounter: Payer: Self-pay | Admitting: Family Medicine

## 2022-08-16 ENCOUNTER — Other Ambulatory Visit: Payer: Self-pay | Admitting: Family Medicine

## 2022-08-16 ENCOUNTER — Encounter: Payer: Self-pay | Admitting: Family Medicine

## 2022-08-16 MED ORDER — VALSARTAN 160 MG PO TABS: 160.0000 mg | ORAL_TABLET | Freq: Every day | ORAL | 3 refills | Status: AC

## 2022-10-30 ENCOUNTER — Encounter: Payer: Self-pay | Admitting: Adult Health

## 2022-10-30 ENCOUNTER — Ambulatory Visit (INDEPENDENT_AMBULATORY_CARE_PROVIDER_SITE_OTHER): Payer: Managed Care, Other (non HMO) | Admitting: Adult Health

## 2022-10-30 VITALS — BP 124/82 | HR 79 | Ht 63.0 in | Wt 283.0 lb

## 2022-10-30 DIAGNOSIS — Z3041 Encounter for surveillance of contraceptive pills: Secondary | ICD-10-CM | POA: Diagnosis not present

## 2022-10-30 MED ORDER — NORETHINDRONE 0.35 MG PO TABS: 1.0000 | ORAL_TABLET | Freq: Every day | ORAL | 4 refills | Status: DC

## 2022-10-30 NOTE — Progress Notes (Signed)
  Subjective:     [name], female   DOB: 03-21-1991, 31 y.o.   MRN: [id number]  HPI Brittany [name] is a 31 year old white female, with SO, G1P0101, in to get Micronor refilled.  Last pap was negative HPV and malignancy 06/15/20  PCP is Dr Tanya Nones  Review of Systems Happy with Micronor Reviewed past medical,surgical, social and family history. Reviewed medications and allergies.     Objective:   Physical Exam BP 124/82 (BP Location: Left Arm, Patient Position: Sitting, Cuff Size: Normal)   Pulse 79   Ht 5\' 3"  (1.6 m)   Wt 283 lb (128.4 kg)   BMI 50.13 kg/m  Skin warm and dry. Lungs: clear to ausculation bilaterally. Cardiovascular: regular rate and rhythm.    Fall risk is low  Upstream - 10/30/22 1616       Pregnancy Intention Screening   Does the patient want to become pregnant in the next year? No    Does the patient's partner want to become pregnant in the next year? No    Would the patient like to discuss contraceptive options today? No      Contraception Wrap Up   Current Method Oral Contraceptive    End Method Oral Contraceptive             Assessment:     1. Encounter for surveillance of contraceptive pills Happy with Micronor, will refill Meds ordered this encounter  Medications   norethindrone (MICRONOR) 0.35 MG tablet    Sig: Take 1 tablet (0.35 mg total) by mouth daily.    Dispense:  84 tablet    Refill:  4    Order Specific Question:   Supervising Provider    Answer:   11/01/22 [[id number]]        Plan:     Return in 8 months for pap and physical

## 2022-10-31 ENCOUNTER — Encounter: Payer: Self-pay | Admitting: Family Medicine

## 2022-10-31 ENCOUNTER — Other Ambulatory Visit: Payer: Self-pay | Admitting: Family Medicine

## 2022-10-31 MED ORDER — ZEPBOUND 2.5 MG/0.5ML ~~LOC~~ SOAJ
2.5000 mg | SUBCUTANEOUS | 1 refills | Status: DC
Start: 2022-10-31 — End: 2022-11-20

## 2022-11-18 ENCOUNTER — Other Ambulatory Visit: Payer: Self-pay | Admitting: Family Medicine

## 2022-11-20 ENCOUNTER — Other Ambulatory Visit: Payer: Self-pay

## 2022-11-20 DIAGNOSIS — Z713 Dietary counseling and surveillance: Secondary | ICD-10-CM

## 2022-11-20 DIAGNOSIS — Z6841 Body Mass Index (BMI) 40.0 and over, adult: Secondary | ICD-10-CM

## 2022-11-20 DIAGNOSIS — E669 Obesity, unspecified: Secondary | ICD-10-CM

## 2022-11-20 MED ORDER — ZEPBOUND 2.5 MG/0.5ML ~~LOC~~ SOAJ: 2.5000 mg | SUBCUTANEOUS | 1 refills | Status: DC

## 2023-01-27 ENCOUNTER — Other Ambulatory Visit: Payer: Self-pay | Admitting: Family Medicine

## 2023-04-04 ENCOUNTER — Other Ambulatory Visit: Payer: Self-pay | Admitting: Family Medicine

## 2023-04-04 NOTE — Telephone Encounter (Signed)
Requested medication (s) are due for refill today: yes  Requested medication (s) are on the active medication list: yes  Last refill:  01/27/23   Future visit scheduled: no  Notes to clinic:  Unable to refill per protocol due to failed labs, no updated results.    Requested Prescriptions  Pending Prescriptions Disp Refills   venlafaxine XR (EFFEXOR-XR) 75 MG 24 hr capsule [Pharmacy Med Name: VENLAFAXINE ER 75MG  CAPSULES] 30 capsule 1    Sig: TAKE 1 CAPSULE(75 MG) BY MOUTH DAILY WITH BREAKFAST. STOP LEXAPRO     Psychiatry: Antidepressants - SNRI - desvenlafaxine & venlafaxine Failed - 04/04/2023  5:25 PM      Failed - Valid encounter within last 6 months    Recent Outpatient Visits           11 months ago Hypersomnolence   [facility] Pickard, Priscille Heidelberg, MD   1 year ago Acute cough   [facility] Tanya Nones, Priscille Heidelberg, MD   1 year ago Elevated blood sugar   [facility] Tanya Nones, Priscille Heidelberg, MD   3 years ago Rhinosinusitis   [facility] Donita Brooks, MD   4 years ago Upper respiratory tract infection, unspecified type   [facility] Medicine Danelle Berry, PA-C              Failed - Lipid Panel in normal range within the last 12 months    Cholesterol, Total  Date Value Ref Range Status  05/09/2015 173 100 - 199 mg/dL Final   LDL Calculated  Date Value Ref Range Status  05/09/2015 78 0 - 99 mg/dL Final   HDL  Date Value Ref Range Status  05/09/2015 45 >39 mg/dL Final    Comment:    According to ATP-III Guidelines, HDL-C >59 mg/dL is considered a negative risk factor for CHD.    Triglycerides  Date Value Ref Range Status  05/09/2015 252 (H) 0 - 149 mg/dL Final         Passed - Cr in normal range and within 360 days    Creat  Date Value Ref Range Status  08/06/2022 0.91 0.50 - 0.97 mg/dL Final   Creatinine, Urine  Date Value Ref Range Status  09/25/2016 50.00 mg/dL Final          Passed - Last BP in normal range    BP Readings from Last 1 Encounters:  10/30/22 124/82

## 2023-06-02 ENCOUNTER — Other Ambulatory Visit: Payer: Self-pay | Admitting: Adult Health

## 2023-06-02 MED ORDER — METRONIDAZOLE 500 MG PO TABS: 500.0000 mg | ORAL_TABLET | Freq: Two times a day (BID) | ORAL | 1 refills | Status: DC

## 2023-06-02 NOTE — Progress Notes (Signed)
Rx flagyl  

## 2023-06-26 ENCOUNTER — Other Ambulatory Visit: Payer: Self-pay | Admitting: Family Medicine

## 2023-06-27 NOTE — Telephone Encounter (Signed)
Requested medication (s) are due for refill today: Yes  Requested medication (s) are on the active medication list: Yes  Last refill:  04/08/23  Future visit scheduled: No  Notes to clinic:  Does pt. Still see Dr. Tanya Nones?    Requested Prescriptions  Pending Prescriptions Disp Refills   venlafaxine XR (EFFEXOR-XR) 75 MG 24 hr capsule [Pharmacy Med Name: VENLAFAXINE ER 75MG  CAPSULES] 30 capsule 1    Sig: TAKE 1 CAPSULE(75 MG) BY MOUTH DAILY WITH BREAKFAST. STOP LEXAPRO     Psychiatry: Antidepressants - SNRI - desvenlafaxine & venlafaxine Failed - 06/26/2023  9:46 AM      Failed - Valid encounter within last 6 months    Recent Outpatient Visits           1 year ago Hypersomnolence   [facility] Pickard, Priscille Heidelberg, MD   1 year ago Acute cough   [facility] Tanya Nones, Priscille Heidelberg, MD   1 year ago Elevated blood sugar   [facility] Tanya Nones, Priscille Heidelberg, MD   3 years ago Rhinosinusitis   [facility] Medicine Donita Brooks, MD   4 years ago Upper respiratory tract infection, unspecified type   [facility] Medicine Danelle Berry, PA-C              Failed - Lipid Panel in normal range within the last 12 months    Cholesterol, Total  Date Value Ref Range Status  05/09/2015 173 100 - 199 mg/dL Final   LDL Calculated  Date Value Ref Range Status  05/09/2015 78 0 - 99 mg/dL Final   HDL  Date Value Ref Range Status  05/09/2015 45 >39 mg/dL Final    Comment:    According to ATP-III Guidelines, HDL-C >59 mg/dL is considered a negative risk factor for CHD.    Triglycerides  Date Value Ref Range Status  05/09/2015 252 (H) 0 - 149 mg/dL Final         Passed - Cr in normal range and within 360 days    Creat  Date Value Ref Range Status  08/06/2022 0.91 0.50 - 0.97 mg/dL Final   Creatinine, Urine  Date Value Ref Range Status  09/25/2016 50.00 mg/dL Final         Passed - Last BP in normal range    BP  Readings from Last 1 Encounters:  10/30/22 124/82

## 2023-07-01 ENCOUNTER — Telehealth: Payer: Self-pay | Admitting: Family Medicine

## 2023-07-01 NOTE — Telephone Encounter (Signed)
Pharmacy sent 2nd refill request for VENLAFAXINE ER 75MG  CAPSULES]   Pharmacy:  [facility] [id number] - [place], [place] - [address] AT [facility] [address], [place] [address] Phone: [phone]  Fax: [phone] DEA #: [id number]   Left message on patient's voicemail to call back for med refill appointment.   Please advise pharmacist.

## 2023-07-01 NOTE — Telephone Encounter (Signed)
Left message to return call; patient needs appointment for med refills.

## 2023-09-04 ENCOUNTER — Ambulatory Visit: Payer: Managed Care, Other (non HMO) | Admitting: Adult Health

## 2023-09-04 ENCOUNTER — Encounter: Payer: Self-pay | Admitting: Adult Health

## 2023-09-04 ENCOUNTER — Other Ambulatory Visit (HOSPITAL_COMMUNITY)
Admission: RE | Admit: 2023-09-04 | Discharge: 2023-09-04 | Disposition: A | Discharge status: Home / Self Care | Payer: Managed Care, Other (non HMO) | Source: Ambulatory Visit | Attending: Adult Health | Admitting: Adult Health

## 2023-09-04 VITALS — BP 138/97 | HR 72 | Ht 63.0 in | Wt 264.5 lb

## 2023-09-04 DIAGNOSIS — Z01419 Encounter for gynecological examination (general) (routine) without abnormal findings: Secondary | ICD-10-CM | POA: Diagnosis present

## 2023-09-04 DIAGNOSIS — I10 Essential (primary) hypertension: Secondary | ICD-10-CM | POA: Diagnosis not present

## 2023-09-04 DIAGNOSIS — Z3041 Encounter for surveillance of contraceptive pills: Secondary | ICD-10-CM | POA: Diagnosis not present

## 2023-09-04 MED ORDER — NORETHINDRONE 0.35 MG PO TABS: 1.0000 | ORAL_TABLET | Freq: Every day | ORAL | 4 refills | Status: DC

## 2023-09-04 NOTE — Progress Notes (Signed)
[name], female   DOB: [birth year], 32 y.o.   MRN: [id number] History of Present Illness: Brittany [name] is a 32 year old white female,with SO, G1P0101 in for a well woman gyn exam and pap. She has missed 2 days of BP meds.  PCP is Ladora Daniel PA   Current Medications, Allergies, Past Medical History, Past Surgical History, Family History and Social History were reviewed in [employer] record.     Review of Systems: Patient denies any headaches, hearing loss, fatigue, blurred vision, shortness of breath, chest pain, abdominal pain, problems with bowel movements, urination, or intercourse. No joint pain or mood swings.   Has lost about 19 lbs since December  Physical Exam:BP (!) 138/97 (BP Location: Right Arm, Patient Position: Sitting, Cuff Size: Large)   Pulse 72   Ht 5\' 3"  (1.6 m)   Wt 264 lb 8 oz (120 kg)   LMP 08/28/2023 (Approximate)   BMI 46.85 kg/m   General:  Well developed, well nourished, no acute distress Skin:  Warm and dry Neck:  Midline trachea, normal thyroid, good ROM, no lymphadenopathy Lungs; Clear to auscultation bilaterally Breast:  No dominant palpable mass, retraction, or nipple discharge Cardiovascular: Regular rate and rhythm Abdomen:  Soft, non tender, no hepatosplenomegaly Pelvic:  External genitalia is normal in appearance, no lesions.  The vagina is normal in appearance. Urethra has no lesions or masses. The cervix is bulbous.Pap with HR HPV genotyping performed.  Uterus is felt to be normal size, shape, and contour.  No adnexal masses or tenderness noted.Bladder is non tender, no masses felt. Extremities/musculoskeletal:  No swelling noted, +spider veins right leg, no clubbing or cyanosis Psych:  No mood changes, alert and cooperative,seems happy AA is 4 Fall risk is low    09/04/2023    3:44 PM 08/06/2022   11:26 AM 09/21/2021   12:32 PM  Depression screen PHQ 2/9  Decreased Interest 0 1 0  Down, Depressed, Hopeless 0  0 1  PHQ - 2 Score 0 1 1  Altered sleeping 0  1  Tired, decreased energy 1  1  Change in appetite 0  1  Feeling bad or failure about yourself  0  0  Trouble concentrating 0  0  Moving slowly or fidgety/restless 0  0  Suicidal thoughts 0  0  PHQ-9 Score 1  4       09/04/2023    3:44 PM 09/21/2021   12:32 PM 06/15/2020    3:06 PM  GAD 7 : Generalized Anxiety Score  Nervous, Anxious, on Edge 0 1 0  Control/stop worrying 0 0 0  Worry too much - different things 0 1 0  Trouble relaxing 0 0 0  Restless 0 0 0  Easily annoyed or irritable 0 0 0  Afraid - awful might happen 0 0 0  Total GAD 7 Score 0 2 0  Anxiety Difficulty   Not difficult at all    Upstream - 09/04/23 1543       Pregnancy Intention Screening   Does the patient want to become pregnant in the next year? No    Does the patient's partner want to become pregnant in the next year? No    Would the patient like to discuss contraceptive options today? No      Contraception Wrap Up   Current Method Oral Contraceptive    End Method Oral Contraceptive    Contraception Counseling Provided Yes  Examination chaperoned by Malachy Mood LPN    Impression and Plan: 1. Encounter for gynecological examination with Papanicolaou smear of cervix Pap sent Pap in 3 years if normal Physical in 1 year - Cytology - PAP( [place]) Labs with PCP  2. Hypertension, unspecified type Get meds and take  Follow up with PCP  3. Encounter for surveillance of contraceptive pills Happy with Micronor, will refill Meds ordered this encounter  Medications   norethindrone (MICRONOR) 0.35 MG tablet    Sig: Take 1 tablet (0.35 mg total) by mouth daily.    Dispense:  84 tablet    Refill:  4    Order Specific Question:   Supervising Provider    Answer:   Duane Lope H [[id number]]

## 2023-09-05 ENCOUNTER — Other Ambulatory Visit: Payer: Self-pay | Admitting: Family Medicine

## 2023-09-08 NOTE — Telephone Encounter (Signed)
Pt no longer pt of prescriber   Requested Prescriptions  Refused Prescriptions Disp Refills   valsartan (DIOVAN) 160 MG tablet [Pharmacy Med Name: VALSARTAN 160MG  TABLETS] 90 tablet 3    Sig: TAKE 1 TABLET(160 MG) BY MOUTH DAILY     Cardiovascular:  Angiotensin Receptor Blockers Failed - 09/05/2023  2:09 PM      Failed - Cr in normal range and within 180 days    Creat  Date Value Ref Range Status  08/06/2022 0.91 0.50 - 0.97 mg/dL Final   Creatinine, Urine  Date Value Ref Range Status  09/25/2016 50.00 mg/dL Final         Failed - K in normal range and within 180 days    Potassium  Date Value Ref Range Status  08/06/2022 4.8 3.5 - 5.3 mmol/L Final         Failed - Last BP in normal range    BP Readings from Last 1 Encounters:  09/04/23 (!) 138/97         Failed - Valid encounter within last 6 months    Recent Outpatient Visits           1 year ago Hypersomnolence   [facility] Donita Brooks, MD   2 years ago Acute cough   [facility] Donita Brooks, MD   2 years ago Elevated blood sugar   [facility] Tanya Nones, Priscille Heidelberg, MD   3 years ago Rhinosinusitis   [facility] Donita Brooks, MD   4 years ago Upper respiratory tract infection, unspecified type   [facility] Danelle Berry, PA-C              Passed - Patient is not pregnant

## 2023-09-11 LAB — CYTOLOGY - PAP
Comment: NEGATIVE
Diagnosis: NEGATIVE
Diagnosis: REACTIVE
High risk HPV: NEGATIVE

## 2023-12-15 ENCOUNTER — Other Ambulatory Visit: Payer: Self-pay | Admitting: Family Medicine

## 2024-07-27 ENCOUNTER — Other Ambulatory Visit: Payer: Self-pay | Admitting: Adult Health

## 2024-07-27 MED ORDER — METRONIDAZOLE 500 MG PO TABS: 500.0000 mg | ORAL_TABLET | Freq: Two times a day (BID) | ORAL | 0 refills | Status: DC

## 2024-07-27 NOTE — Progress Notes (Signed)
 Rx flagyl

## 2024-09-06 ENCOUNTER — Ambulatory Visit: Admitting: Adult Health

## 2024-09-06 ENCOUNTER — Encounter: Payer: Self-pay | Admitting: Adult Health

## 2024-09-06 VITALS — BP 123/86 | HR 71 | Ht 63.0 in | Wt 274.0 lb

## 2024-09-06 DIAGNOSIS — Z01419 Encounter for gynecological examination (general) (routine) without abnormal findings: Secondary | ICD-10-CM

## 2024-09-06 DIAGNOSIS — Z1331 Encounter for screening for depression: Secondary | ICD-10-CM

## 2024-09-06 DIAGNOSIS — Z3041 Encounter for surveillance of contraceptive pills: Secondary | ICD-10-CM

## 2024-09-06 MED ORDER — NORETHINDRONE 0.35 MG PO TABS: 1.0000 | ORAL_TABLET | Freq: Every day | ORAL | 4 refills | Status: AC

## 2024-09-06 NOTE — Progress Notes (Signed)
 Patient ID: SHATORIA STOOKSBURY, female   DOB: 30-Aug-1991, 33 y.o.   MRN: [id number] History of Present Illness: Reeda is a 33 year old white female, with SO, G1P0101, in for a well woman gyn exam. She is taking zepbound  now.     Component Value Date/Time   DIAGPAP  09/04/2023 1549    - Negative for Intraepithelial Lesions or Malignancy (NILM)   DIAGPAP - Benign reactive/reparative changes 09/04/2023 1549   DIAGPAP  06/15/2020 1501    - Negative for intraepithelial lesion or malignancy (NILM)   HPVHIGH Negative 09/04/2023 1549   HPVHIGH Negative 06/15/2020 1501   ADEQPAP  09/04/2023 1549    Satisfactory for evaluation; transformation zone component PRESENT.   ADEQPAP  06/15/2020 1501    Satisfactory for evaluation; transformation zone component PRESENT.   ADEQPAP  02/10/2017 0000    Satisfactory for evaluation  endocervical/transformation zone component PRESENT.    PCP is Tylene Meres PA.  Current Medications, Allergies, Past Medical History, Past Surgical History, Family History and Social History were reviewed in [employer] record.     Review of Systems: Patient denies any headaches, hearing loss, fatigue, blurred vision, shortness of breath, chest pain, abdominal pain, problems with bowel movements, urination, or intercourse. No joint pain or mood swings.     Physical Exam:BP 123/86 (BP Location: Left Arm, Patient Position: Sitting, Cuff Size: Large)   Pulse 71   Ht 5' 3 (1.6 m)   Wt 274 lb (124.3 kg)   LMP 09/02/2024 (Approximate)   BMI 48.54 kg/m   General:  Well developed, well nourished, no acute distress Skin:  Warm and dry Neck:  Midline trachea, normal thyroid, good ROM, no lymphadenopathy Lungs; Clear to auscultation bilaterally Breast:  No dominant palpable mass, retraction, or nipple discharge Cardiovascular: Regular rate and rhythm Abdomen:  Soft, non tender, no hepatosplenomegaly Pelvic:  External genitalia is normal in appearance, no  lesions.  The vagina is normal in appearance. Urethra has no lesions or masses. The cervix is smooth.  Uterus is felt to be normal size, shape, and contour.  No adnexal masses or tenderness noted.Bladder is non tender, no masses felt. Extremities/musculoskeletal:  No swelling or varicosities noted, no clubbing or cyanosis Psych:  No mood changes, alert and cooperative,seems happy AA is 3 Fall risk is low    09/06/2024    3:31 PM 09/04/2023    3:44 PM 08/06/2022   11:26 AM  Depression screen PHQ 2/9  Decreased Interest 0 0 1  Down, Depressed, Hopeless 0 0 0  PHQ - 2 Score 0 0 1  Altered sleeping 0 0   Tired, decreased energy 1 1   Change in appetite 0 0   Feeling bad or failure about yourself  0 0   Trouble concentrating 0 0   Moving slowly or fidgety/restless 0 0   Suicidal thoughts 0 0   PHQ-9 Score 1 1    She is on meds     09/06/2024    3:31 PM 09/04/2023    3:44 PM 09/21/2021   12:32 PM 06/15/2020    3:06 PM  GAD 7 : Generalized Anxiety Score  Nervous, Anxious, on Edge 0 0 1 0  Control/stop worrying 0 0 0 0  Worry too much - different things 0 0 1 0  Trouble relaxing 0 0 0 0  Restless 0 0 0 0  Easily annoyed or irritable 0 0 0 0  Afraid - awful might happen 0 0 0 0  Total GAD 7 Score 0 0 2 0  Anxiety Difficulty    Not difficult at all      Upstream - 09/06/24 1529       Pregnancy Intention Screening   Does the patient want to become pregnant in the next year? No    Does the patient's partner want to become pregnant in the next year? No    Would the patient like to discuss contraceptive options today? No      Contraception Wrap Up   Current Method Oral Contraceptive    End Method Oral Contraceptive;Female Condom    Contraception Counseling Provided Yes    How was the end contraceptive method provided? Prescription          Examination chaperoned by Clarita Salt LPN  Impression and plan: 1. Encounter for well woman exam with routine gynecological exam  (Primary) Pap in 2027 Physical in 1 year Labs with PCP   2. Encounter for surveillance of contraceptive pills Refilled Micronor  and discussed using condoms as back up since on zepbound  now Meds ordered this encounter  Medications   norethindrone  (MICRONOR ) 0.35 MG tablet    Sig: Take 1 tablet (0.35 mg total) by mouth daily.    Dispense:  84 tablet    Refill:  4    Supervising Provider:   JAYNE MINDER H [[id number]]
